# Patient Record
Sex: Female | Born: 1985 | Race: White | Hispanic: No | Marital: Married | Smoking: Former smoker
Health system: Southern US, Community
[De-identification: ages and names within clinical notes are randomized; demographics above are authoritative.]

## PROBLEM LIST (undated history)

## (undated) DIAGNOSIS — T7840XA Allergy, unspecified, initial encounter: Secondary | ICD-10-CM

## (undated) DIAGNOSIS — F319 Bipolar disorder, unspecified: Secondary | ICD-10-CM

## (undated) DIAGNOSIS — F1910 Other psychoactive substance abuse, uncomplicated: Secondary | ICD-10-CM

## (undated) DIAGNOSIS — J349 Unspecified disorder of nose and nasal sinuses: Secondary | ICD-10-CM

## (undated) DIAGNOSIS — F419 Anxiety disorder, unspecified: Secondary | ICD-10-CM

## (undated) DIAGNOSIS — I10 Essential (primary) hypertension: Secondary | ICD-10-CM

## (undated) DIAGNOSIS — D649 Anemia, unspecified: Secondary | ICD-10-CM

## (undated) HISTORY — DX: Anemia, unspecified: D64.9

## (undated) HISTORY — DX: Other psychoactive substance abuse, uncomplicated: F19.10

## (undated) HISTORY — DX: Allergy, unspecified, initial encounter: T78.40XA

## (undated) HISTORY — PX: OTHER SURGICAL HISTORY: SHX169

## (undated) HISTORY — DX: Essential (primary) hypertension: I10

---
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
## 2006-01-07 ENCOUNTER — Emergency Department: Payer: Self-pay | Admitting: General Practice

## 2007-09-29 ENCOUNTER — Inpatient Hospital Stay: Payer: Self-pay | Admitting: Psychiatry

## 2007-09-29 ENCOUNTER — Inpatient Hospital Stay: Payer: Self-pay | Admitting: Internal Medicine

## 2007-09-29 ENCOUNTER — Other Ambulatory Visit: Payer: Self-pay

## 2010-04-12 ENCOUNTER — Emergency Department: Payer: Self-pay | Admitting: Emergency Medicine

## 2015-04-17 DIAGNOSIS — F319 Bipolar disorder, unspecified: Secondary | ICD-10-CM | POA: Insufficient documentation

## 2015-04-17 DIAGNOSIS — F411 Generalized anxiety disorder: Secondary | ICD-10-CM | POA: Insufficient documentation

## 2015-05-26 ENCOUNTER — Other Ambulatory Visit: Payer: Self-pay | Admitting: Obstetrics and Gynecology

## 2015-05-26 DIAGNOSIS — N979 Female infertility, unspecified: Secondary | ICD-10-CM

## 2015-06-01 ENCOUNTER — Ambulatory Visit
Admission: RE | Admit: 2015-06-01 | Discharge: 2015-06-01 | Disposition: A | Discharge status: Home / Self Care | Payer: BLUE CROSS/BLUE SHIELD | Source: Ambulatory Visit | Attending: Obstetrics and Gynecology | Admitting: Obstetrics and Gynecology

## 2015-06-01 ENCOUNTER — Ambulatory Visit: Admission: RE | Admit: 2015-06-01 | Payer: Self-pay | Source: Ambulatory Visit

## 2015-06-01 DIAGNOSIS — N979 Female infertility, unspecified: Secondary | ICD-10-CM | POA: Diagnosis present

## 2017-05-13 ENCOUNTER — Other Ambulatory Visit: Payer: Self-pay

## 2017-05-13 ENCOUNTER — Ambulatory Visit
Admission: EM | Admit: 2017-05-13 | Discharge: 2017-05-13 | Disposition: A | Discharge status: Home / Self Care | Payer: BLUE CROSS/BLUE SHIELD | Attending: Family Medicine | Admitting: Family Medicine

## 2017-05-13 DIAGNOSIS — L304 Erythema intertrigo: Secondary | ICD-10-CM | POA: Diagnosis not present

## 2017-05-13 HISTORY — DX: Unspecified disorder of nose and nasal sinuses: J34.9

## 2017-05-13 HISTORY — DX: Anxiety disorder, unspecified: F41.9

## 2017-05-13 HISTORY — DX: Bipolar disorder, unspecified: F31.9

## 2017-05-13 MED ORDER — KETOCONAZOLE 2 % EX CREA: 1.0000 "application " | TOPICAL_CREAM | Freq: Every day | CUTANEOUS | 0 refills | Status: DC

## 2017-05-13 MED ORDER — TRIAMCINOLONE ACETONIDE 0.025 % EX CREA
1.0000 | TOPICAL_CREAM | Freq: Two times a day (BID) | CUTANEOUS | 0 refills | Status: DC
Start: 2017-05-13 — End: 2017-05-13

## 2017-05-13 MED ORDER — TRIAMCINOLONE ACETONIDE 0.025 % EX CREA: 1.0000 "application " | TOPICAL_CREAM | Freq: Two times a day (BID) | CUTANEOUS | 0 refills | Status: DC

## 2017-05-13 MED ORDER — MUPIROCIN 2 % EX OINT: 1.0000 "application " | TOPICAL_OINTMENT | Freq: Three times a day (TID) | CUTANEOUS | 0 refills | Status: DC

## 2017-05-13 MED ORDER — FLUCONAZOLE 150 MG PO TABS: ORAL_TABLET | ORAL | 0 refills | Status: DC

## 2017-05-13 NOTE — ED Provider Notes (Signed)
[facility]    CSN: [id number] Arrival date & time: 05/13/17  0901     History   Chief Complaint Chief Complaint  Patient presents with  . Rash    HPI Hailey Castaneda is a 32 y.o. female.   HPI  32 year old female presents with 4-5-week history of rash on her breasts.  Is that the skin is starting to break down and it is very malodorous.  Is now switched from polyester type bras to sport bras in an effort to decrease the irritation.  Been using over-the-counter nystatin cream and powder but none has seemed to work well.  No fever or chills.          Past Medical History:  Diagnosis Date  . Anxiety   . Bipolar 1 disorder (HCC)   . Sinus disorder     There are no active problems to display for this patient.   History reviewed. No pertinent surgical history.  OB History   None      Home Medications    Prior to Admission medications   Medication Sig Start Date End Date Taking? Authorizing Provider  ALPRAZolam Prudy Feeler) 0.5 MG tablet Take by mouth. 04/14/17  Yes [provider]  cetirizine (ZYRTEC) 10 MG tablet Take by mouth. 02/20/17  Yes [provider]  fluticasone (FLONASE) 50 MCG/ACT nasal spray Place into the nose. 02/20/17  Yes [provider]  lamoTRIgine (LAMICTAL) 150 MG tablet Take by mouth. 02/20/17  Yes [provider]  fluconazole (DIFLUCAN) 150 MG tablet Take 1 tab weekly for 4 weeks. 05/13/17   Lutricia Feil, PA-C  ketoconazole (NIZORAL) 2 % cream Apply 1 application topically daily. 05/13/17   Lutricia Feil, PA-C  mupirocin ointment (BACTROBAN) 2 % Apply 1 application topically 3 (three) times daily. 05/13/17   Lutricia Feil, PA-C  triamcinolone (KENALOG) 0.025 % cream Apply 1 application topically 2 (two) times daily. 05/13/17   Lutricia Feil, PA-C    Family History History reviewed. No pertinent family history.  Social History Social History   Tobacco Use  . Smoking status: Former  Games developer  . Smokeless tobacco: Never Used  Substance Use Topics  . Alcohol use: Yes    Comment: social  . Drug use: Never     Allergies   Azithromycin and Penicillins   Review of Systems Review of Systems  Constitutional: Negative for activity change, appetite change, chills, fatigue and fever.  Skin: Positive for rash and wound.  All other systems reviewed and are negative.    Physical Exam Triage Vital Signs ED Triage Vitals [05/13/17 0921]  Enc Vitals Group     BP (!) 158/116     Pulse Rate 90     Resp 16     Temp 98.4 F (36.9 C)     Temp Source Oral     SpO2 100 %     Weight 147 lb (66.7 kg)     Height 5\' 6"  (1.676 m)     Head Circumference      Peak Flow      Pain Score 5     Pain Loc      Pain Edu?      Excl. in GC?    No data found.  Updated Vital Signs BP (!) 158/116 (BP Location: Right Arm)   Pulse 90   Temp 98.4 F (36.9 C) (Oral)   Resp 16   Ht 5\' 6"  (1.676 m)   Wt 147 lb (  66.7 kg)   LMP 05/08/2017   SpO2 100%   BMI 23.73 kg/m   Visual Acuity Right Eye Distance:   Left Eye Distance:   Bilateral Distance:    Right Eye Near:   Left Eye Near:    Bilateral Near:     Physical Exam  Constitutional: She is oriented to person, place, and time. She appears well-developed and well-nourished. No distress.  HENT:  Head: Normocephalic.  Eyes: Pupils are equal, round, and reactive to light. Right eye exhibits no discharge. Left eye exhibits no discharge.  Neck: Normal range of motion.  Musculoskeletal: Normal range of motion.  Neurological: She is alert and oriented to person, place, and time.  Skin: Skin is warm and dry. Rash noted. She is not diaphoretic.  Examination was performed in the presence of Gunnar FusiPaula, CMA as Nurse, children'schaperone and assistant.  Skin breakdown of the inframammary skin folds.  It is very malodorous.  Area erythematous areas of weeping are apparent.  Not appear to be infection at the present time but has a very high risk.  There are  several small excoriations present.  Psychiatric: She has a normal mood and affect. Her behavior is normal. Judgment and thought content normal.  Nursing note and vitals reviewed.    UC Treatments / Results  Labs (all labs ordered are listed, but only abnormal results are displayed) Labs Reviewed - No data to display  EKG None Radiology No results found.  Procedures Procedures (including critical care time)  Medications Ordered in UC Medications - No data to display   Initial Impression / Assessment and Plan / UC Course  I have reviewed the triage vital signs and the nursing notes.  Pertinent labs & imaging results that were available during my care of the patient were reviewed by me and considered in my medical decision making (see chart for details).     Plan: 1. Test/x-ray results and diagnosis reviewed with patient 2. rx as per orders; risks, benefits, potential side effects reviewed with patient 3. Recommend supportive treatment with meticulous drying of the area.  Using a hair dryer on cool setting after showers.  Recommend use of drying powders between treatments.  Switch her to Nizoral cream that she will use on a daily basis.  So we will treat her with Diflucan 150 mg once weekly for the next 4 weeks.  Given her triamcinolone 0.025% that she may use twice daily for severe itching.  Also given her a Bactroban ointment for secondary infection should any occur.  Told that this will take 3-4 weeks for healing.  Is not improving I have given her the name of a dermatologist that she may schedule an appointment. 4. F/u prn if symptoms worsen or don't improve   Final Clinical Impressions(s) / UC Diagnoses   Final diagnoses:  Intertrigo    ED Discharge Orders        Ordered    ketoconazole (NIZORAL) 2 % cream  Daily     05/13/17 1037    triamcinolone (KENALOG) 0.025 % cream  2 times daily,   Status:  Discontinued     05/13/17 1037    mupirocin ointment (BACTROBAN) 2 %   3 times daily     05/13/17 1037    fluconazole (DIFLUCAN) 150 MG tablet     05/13/17 1037    triamcinolone (KENALOG) 0.025 % cream  2 times daily     05/13/17 1039       Controlled Substance Prescriptions [place] Controlled Substance  Registry consulted? Not Applicable   Lutricia Feil, PA-C 05/13/17 1052

## 2017-05-13 NOTE — ED Triage Notes (Signed)
Pt reports 4-5 weeks ago she noticed a rash under her breasts. She thinks it is yeast. Skin is starting to break down.

## 2017-05-14 ENCOUNTER — Telehealth: Payer: Self-pay | Admitting: Emergency Medicine

## 2017-05-14 NOTE — Telephone Encounter (Signed)
Patient calling stating that she has "red dots" some of them raised on her neck, upper chest and stomach since this morning. Patient is concerned that this might be an allergic reaction to medications she was given yesterday. Advised patient that it is possible, but without seeing the rash we can't be certain. Patient states she doesn't want to come in and pay another $100 co-pay. She states she has taken Benadryl to see if that helps. Advised to continue Benadryl and if rash gets worse or gets swelling, sob to go to ED. Patient agreed and voiced understanding.

## 2018-02-03 IMAGING — RF DG HYSTEROGRAM
1 series · 1 of 1 positions shown · IV contrast (omnipaque)
Comparison: None in PACs

CLINICAL DATA: Beam else the relatively issues

EXAM:
HYSTEROSALPINGOGRAM
TECHNIQUE: Following cleansing of the cervix and vagina with Betadine solution,
a hysterosalpingogram was performed using a 5-French
hysterosalpingogram catheter and Omnipaque 300 contrast. The patient
tolerated the examination without difficulty.

[Series 1: cp_standard · 0.17mm/px · 1 of 1 slices shown]
[im 1/1]
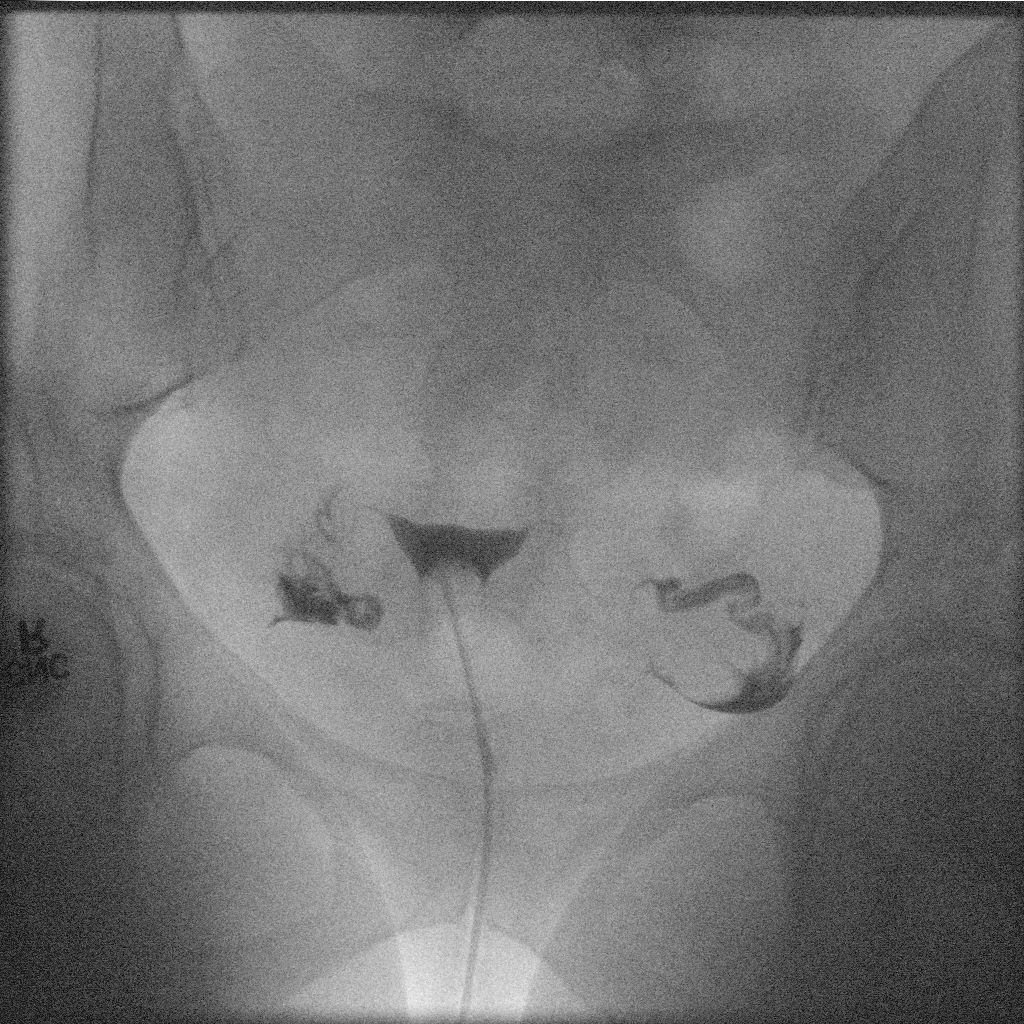

[1 of 1 positions shown; findings below may reference images not displayed]

FLUOROSCOPY TIME:  Fluoroscopy Time:  0 minutes, 30 seconds

Number of Acquired Images:  1 fluoro spot saved image
FINDINGS: The contour the uterus is normal. The fallopian tubes to appear to
be normal in caliber. Free spillage is observed bilaterally.
IMPRESSION: The uterus and fallopian tubes are normal in appearance.

## 2018-09-22 DIAGNOSIS — L409 Psoriasis, unspecified: Secondary | ICD-10-CM | POA: Insufficient documentation

## 2018-09-22 DIAGNOSIS — F1010 Alcohol abuse, uncomplicated: Secondary | ICD-10-CM | POA: Insufficient documentation

## 2018-11-09 ENCOUNTER — Other Ambulatory Visit: Payer: Self-pay

## 2018-11-09 DIAGNOSIS — Z20822 Contact with and (suspected) exposure to covid-19: Secondary | ICD-10-CM

## 2018-11-11 LAB — NOVEL CORONAVIRUS, NAA: SARS-CoV-2, NAA: NOT DETECTED

## 2020-06-21 ENCOUNTER — Encounter: Payer: Self-pay | Admitting: Nurse Practitioner

## 2020-06-21 ENCOUNTER — Ambulatory Visit (INDEPENDENT_AMBULATORY_CARE_PROVIDER_SITE_OTHER): Payer: 59 | Admitting: Nurse Practitioner

## 2020-06-21 ENCOUNTER — Other Ambulatory Visit: Payer: Self-pay

## 2020-06-21 VITALS — BP 126/98 | HR 139 | Temp 99.0°F | Ht 64.8 in | Wt 127.4 lb

## 2020-06-21 DIAGNOSIS — R000 Tachycardia, unspecified: Secondary | ICD-10-CM

## 2020-06-21 DIAGNOSIS — R5383 Other fatigue: Secondary | ICD-10-CM

## 2020-06-21 DIAGNOSIS — R200 Anesthesia of skin: Secondary | ICD-10-CM

## 2020-06-21 DIAGNOSIS — R202 Paresthesia of skin: Secondary | ICD-10-CM

## 2020-06-21 DIAGNOSIS — Z7689 Persons encountering health services in other specified circumstances: Secondary | ICD-10-CM

## 2020-06-21 DIAGNOSIS — F411 Generalized anxiety disorder: Secondary | ICD-10-CM

## 2020-06-21 DIAGNOSIS — F319 Bipolar disorder, unspecified: Secondary | ICD-10-CM | POA: Diagnosis not present

## 2020-06-21 DIAGNOSIS — L409 Psoriasis, unspecified: Secondary | ICD-10-CM

## 2020-06-21 NOTE — Progress Notes (Signed)
BP (!) 126/98   Pulse (!) 139   Temp 99 F (37.2 C)   Ht 5' 4.8" (1.646 m)   Wt 127 lb 6 oz (57.8 kg)   SpO2 97%   BMI 21.33 kg/m    Subjective:    Patient ID: Hailey Castaneda, female    DOB: [birth year], 35 y.o.   MRN: [id number]  HPI: Hailey Castaneda is a 35 y.o. female  Chief Complaint  Patient presents with  . Establish Care  . Amenorrhea    Has not had a cycle since August 2021  . Weight Loss    Patient has lost around 26 pounds in the last 4 months  . Joint Pain  . Palpitations  . Numbness    Fingers and toes   Patient presents to clinic to establish care with new PCP.  Patient reports a history of Bipolar, Anxiety, Depression, Alcohol abuse, and psoriasis.  Patient sees Dr. Phillip Heal for Dermatology. Patient states she diagnosed with Lupus as a child.  Has had positive ANA's in the past. Patient has not seen a rheumatologist recently. She has the butterfly rash and swollen lymph nodes.   Patient denies a history of: Hypertension, Elevated Cholesterol, Thyroid problems, Diabetes, Neurological problems, and Abdominal problems.   Over the past 4 months patient has unintentionally lost 26lbs, lack of appetite, she feels like she has to force herself to eat at times, Patient is having numbness and tingling in her fingers and toes. Patient has fatigue.  Patient is having trouble sleeping.  Feels like she has been asleep for several hours and wakes up and its only been 20 mins.  Feels her heart beating fast.   Relevant past medical, surgical, family and social history reviewed and updated as indicated. Interim medical history since our last visit reviewed. Allergies and medications reviewed and updated.  Review of Systems  Constitutional: Positive for appetite change, fatigue and unexpected weight change.  Gastrointestinal:       Decreased appetite  Neurological: Positive for numbness.  Psychiatric/Behavioral: Positive for sleep disturbance. The patient is  nervous/anxious.     Per HPI unless specifically indicated above     Objective:    BP (!) 126/98   Pulse (!) 139   Temp 99 F (37.2 C)   Ht 5' 4.8" (1.646 m)   Wt 127 lb 6 oz (57.8 kg)   SpO2 97%   BMI 21.33 kg/m   Wt Readings from Last 3 Encounters:  06/21/20 127 lb 6 oz (57.8 kg)  05/13/17 147 lb (66.7 kg)    Physical Exam Vitals and nursing note reviewed.  Constitutional:      General: She is not in acute distress.    Appearance: Normal appearance. She is normal weight. She is not ill-appearing, toxic-appearing or diaphoretic.  HENT:     Head: Normocephalic.     Right Ear: External ear normal.     Left Ear: External ear normal.     Nose: Nose normal.     Mouth/Throat:     Mouth: Mucous membranes are moist.     Pharynx: Oropharynx is clear.  Eyes:     General:        Right eye: No discharge.        Left eye: No discharge.     Extraocular Movements: Extraocular movements intact.     Conjunctiva/sclera: Conjunctivae normal.     Pupils: Pupils are equal, round, and reactive to light.  Cardiovascular:     Rate  and Rhythm: Regular rhythm. Tachycardia present.     Heart sounds: No murmur heard.   Pulmonary:     Effort: Pulmonary effort is normal. No respiratory distress.     Breath sounds: Normal breath sounds. No wheezing or rales.  Musculoskeletal:     Cervical back: Normal range of motion and neck supple.  Skin:    General: Skin is warm and dry.     Capillary Refill: Capillary refill takes less than 2 seconds.  Neurological:     General: No focal deficit present.     Mental Status: She is alert and oriented to person, place, and time. Mental status is at baseline.  Psychiatric:        Mood and Affect: Mood normal.        Behavior: Behavior normal.        Thought Content: Thought content normal.        Judgment: Judgment normal.     Results for orders placed or performed in visit on 11/09/18  Novel Coronavirus, NAA ([facility])   Specimen:  Oropharyngeal(OP) collection in vial transport medium   OROPHARYNGEA  TESTING  Result Value Ref Range   SARS-CoV-2, NAA Not Detected Not Detected      Assessment & Plan:   Problem List Items Addressed This Visit      Musculoskeletal and Integument   Psoriasis    Chronic.  Controlled on current regimen.  Followed by Dr. Phillip Heal.  Due to patient's symptoms, can consider possibility of psoriatic arthritis.  Labs ordered today. Will likely refer to Rheumatology.         Other   Bipolar 1 disorder ([place]) - Primary    Chronic.  Controlled.  Previously followed by her PCP.  Has not seen Psychiatry in many years.  Stable on current dose of Lamictal. Referral placed for patient to see Psychiatry.       Relevant Orders   Ambulatory referral to Psychiatry   GAD (generalized anxiety disorder)    Chronic.  Controlled.  Previously followed by her PCP.  Has not seen Psychiatry in many years.  Patient uses about 20 Xanax per month from PCP. Referral placed for patient to see Psychiatry.       Relevant Medications   hydrOXYzine (ATARAX/VISTARIL) 25 MG tablet   Other Relevant Orders   Ambulatory referral to Psychiatry   Comp Met (CMET)    Other Visit Diagnoses    Other fatigue       Labs ordered today to rule out thyroid disorder or autoimmune disease. Will make recommendations based on lab work.    Relevant Orders   B12   Vitamin D (25 hydroxy)   Antinuclear Antib (ANA)   C-reactive protein   Sed Rate (ESR)   Autoimmune Profile   Thyroid Panel With TSH   Numbness and tingling       Labs ordered today to rule out thyroid disorder or autoimmune disease. Will make recommendations based on lab work.    Relevant Orders   CBC w/Diff   C-reactive protein   Sed Rate (ESR)   Autoimmune Profile   Thyroid Panel With TSH   Tachycardia       EKG ordered in office today. ST depression noted on EKG. Will refer to Cardiology for further evaluation.   Relevant Orders   EKG 12-Lead (Completed)    Autoimmune Profile   Thyroid Panel With TSH   Ambulatory referral to Cardiology   Encounter to establish care  Follow up plan: Return in about 3 months (around 09/21/2020) for Physical and Fasting labs (PAP).

## 2020-06-21 NOTE — Assessment & Plan Note (Signed)
Chronic.  Controlled on current regimen.  Followed by Dr. Cheree Ditto.  Due to patient's symptoms, can consider possibility of psoriatic arthritis.  Labs ordered today. Will likely refer to Rheumatology.

## 2020-06-21 NOTE — Assessment & Plan Note (Signed)
Chronic.  Controlled.  Previously followed by her PCP.  Has not seen Psychiatry in many years.  Stable on current dose of Lamictal. Referral placed for patient to see Psychiatry.

## 2020-06-21 NOTE — Progress Notes (Signed)
Results discussed with patient during office visit today.  ST depression noted. Referral to Cardiology placed.

## 2020-06-21 NOTE — Assessment & Plan Note (Signed)
Chronic.  Controlled.  Previously followed by her PCP.  Has not seen Psychiatry in many years.  Patient uses about 20 Xanax per month from PCP. Referral placed for patient to see Psychiatry.

## 2020-06-22 LAB — CBC WITH DIFFERENTIAL/PLATELET
Basophils Absolute: 0.1 10*3/uL (ref 0.0–0.2)
Basos: 2 %
EOS (ABSOLUTE): 0.3 10*3/uL (ref 0.0–0.4)
Eos: 4 %
Hematocrit: 40.9 % (ref 34.0–46.6)
Hemoglobin: 14.7 g/dL (ref 11.1–15.9)
Immature Grans (Abs): 0 10*3/uL (ref 0.0–0.1)
Immature Granulocytes: 0 %
Lymphocytes Absolute: 2.7 10*3/uL (ref 0.7–3.1)
Lymphs: 37 %
MCH: 35.3 pg — ABNORMAL HIGH (ref 26.6–33.0)
MCHC: 35.9 g/dL — ABNORMAL HIGH (ref 31.5–35.7)
MCV: 98 fL — ABNORMAL HIGH (ref 79–97)
Monocytes Absolute: 1 10*3/uL — ABNORMAL HIGH (ref 0.1–0.9)
Monocytes: 13 %
Neutrophils Absolute: 3.2 10*3/uL (ref 1.4–7.0)
Neutrophils: 44 %
Platelets: 298 10*3/uL (ref 150–450)
RBC: 4.16 x10E6/uL (ref 3.77–5.28)
RDW: 14.9 % (ref 11.7–15.4)
WBC: 7.3 10*3/uL (ref 3.4–10.8)

## 2020-06-22 LAB — VITAMIN B12: Vitamin B-12: 238 pg/mL (ref 232–1245)

## 2020-06-22 LAB — COMPREHENSIVE METABOLIC PANEL
ALT: 52 IU/L — ABNORMAL HIGH (ref 0–32)
AST: 142 IU/L — ABNORMAL HIGH (ref 0–40)
Albumin/Globulin Ratio: 1 — ABNORMAL LOW (ref 1.2–2.2)
Albumin: 3.6 g/dL — ABNORMAL LOW (ref 3.8–4.8)
Alkaline Phosphatase: 174 IU/L — ABNORMAL HIGH (ref 44–121)
BUN/Creatinine Ratio: 7 — ABNORMAL LOW (ref 9–23)
BUN: 6 mg/dL (ref 6–20)
Bilirubin Total: 0.9 mg/dL (ref 0.0–1.2)
CO2: 23 mmol/L (ref 20–29)
Calcium: 9.4 mg/dL (ref 8.7–10.2)
Chloride: 98 mmol/L (ref 96–106)
Creatinine, Ser: 0.83 mg/dL (ref 0.57–1.00)
Globulin, Total: 3.5 g/dL (ref 1.5–4.5)
Glucose: 94 mg/dL (ref 65–99)
Potassium: 3.5 mmol/L (ref 3.5–5.2)
Sodium: 140 mmol/L (ref 134–144)
Total Protein: 7.1 g/dL (ref 6.0–8.5)
eGFR: 94 mL/min/{1.73_m2} (ref >59)

## 2020-06-22 LAB — SEDIMENTATION RATE: Sed Rate: 10 mm/hr (ref 0–32)

## 2020-06-22 LAB — C-REACTIVE PROTEIN: CRP: <1 mg/L (ref 0–10)

## 2020-06-22 LAB — AUTOIMMUNE PROFILE
Anti Nuclear Antibody (ANA): NEGATIVE
Complement C3, Serum: 178 mg/dL — ABNORMAL HIGH (ref 82–167)
dsDNA Ab: <1 IU/mL (ref 0–9)

## 2020-06-22 LAB — THYROID PANEL WITH TSH
Free Thyroxine Index: 1.7 (ref 1.2–4.9)
T3 Uptake Ratio: 28 % (ref 24–39)
T4, Total: 6 ug/dL (ref 4.5–12.0)
TSH: 3.83 u[IU]/mL (ref 0.450–4.500)

## 2020-06-22 LAB — VITAMIN D 25 HYDROXY (VIT D DEFICIENCY, FRACTURES): Vit D, 25-Hydroxy: 18.9 ng/mL — ABNORMAL LOW (ref 30.0–100.0)

## 2020-06-23 NOTE — Progress Notes (Signed)
Please let patient know that her lab work shows her vitamin B12 is within normal range. Vitamin D is low.  I recommend taking Vitamin D 1000IU once daily.  This could contribute to patient's fatigue. Blood cell count shows that blood cells are larger than normal.  However, there is no anemia present.   Kidneys and electrolytes are within normal limites.  However, Liver function is elevated.  Likely due to alcohol use.  I recommend continuing to cut back on drinking with the goal of stopping.   Inflammatory markers are normal. ANA is normal but C3 is elevated and can be an indication of inflammation. I think it would still be worthwhile to see a rheumatologist.   Your thyroid labs are also normal. Please let me know if you have any questions.

## 2020-06-23 NOTE — Addendum Note (Signed)
Addended by: Larae Grooms on: 06/23/2020 01:57 PM   Modules accepted: Orders

## 2020-08-09 ENCOUNTER — Encounter: Payer: Self-pay | Admitting: Cardiovascular Disease

## 2020-08-09 ENCOUNTER — Other Ambulatory Visit: Payer: Self-pay

## 2020-08-09 ENCOUNTER — Telehealth: Payer: Self-pay

## 2020-08-09 ENCOUNTER — Ambulatory Visit (INDEPENDENT_AMBULATORY_CARE_PROVIDER_SITE_OTHER): Payer: 59 | Admitting: Cardiovascular Disease

## 2020-08-09 ENCOUNTER — Ambulatory Visit (INDEPENDENT_AMBULATORY_CARE_PROVIDER_SITE_OTHER): Payer: 59

## 2020-08-09 VITALS — BP 105/72 | HR 140 | Ht 65.5 in | Wt 119.0 lb

## 2020-08-09 DIAGNOSIS — F1010 Alcohol abuse, uncomplicated: Secondary | ICD-10-CM | POA: Diagnosis not present

## 2020-08-09 DIAGNOSIS — R000 Tachycardia, unspecified: Secondary | ICD-10-CM | POA: Diagnosis not present

## 2020-08-09 DIAGNOSIS — F411 Generalized anxiety disorder: Secondary | ICD-10-CM

## 2020-08-09 DIAGNOSIS — R9431 Abnormal electrocardiogram [ECG] [EKG]: Secondary | ICD-10-CM

## 2020-08-09 DIAGNOSIS — F319 Bipolar disorder, unspecified: Secondary | ICD-10-CM

## 2020-08-09 NOTE — Patient Instructions (Addendum)
Medication Instructions:  No changes  If you need a refill on your cardiac medications before your next appointment, please call your pharmacy.   Lab work: No new labs needed  Testing/Procedures: Echo  Zio (heart monitor) Both for tachycardia   Follow-Up: At [facility], you and your health needs are our priority.  As part of our continuing mission to provide you with exceptional heart care, we have created designated Provider Care Teams.  These Care Teams include your primary Cardiologist (physician) and Advanced Practice Providers (APPs -  Physician Assistants and Nurse Practitioners) who all work together to provide you with the care you need, when you need it.  You will need a follow up appointment : we will call with results  Providers on your designated Care Team:   Nicolasa Ducking, NP Eula Listen, PA-C Marisue Ivan, PA-C [place], [place]  COVID-19 Vaccine Information can be found at: [email or web address] For questions related to vaccine distribution or appointments, please email [email or web address] or call [phone])[phone].    Your physician has requested that you have an echocardiogram. Echocardiography is a painless test that uses sound waves to create images of your heart. It provides your doctor with information about the size and shape of your heart and how well your heart's chambers and valves are working. This procedure takes approximately one hour. There are no restrictions for this procedure.  There is a possibility that an IV may need to be started during your test to inject an image enhancing agent. This is done to obtain more optimal pictures of your heart. Therefore we ask that you do at least drink some water prior to coming in to hydrate your veins.   Heart monitor (Zio patch) for 2 weeks (14 days)   Your physician has recommended that you wear a Zio monitor. This monitor is a medical device that  records the heart's electrical activity. Doctors most often use these monitors to diagnose arrhythmias. Arrhythmias are problems with the speed or rhythm of the heartbeat. The monitor is a small device applied to your chest. You can wear one while you do your normal daily activities. While wearing this monitor if you have any symptoms to push the button and record what you felt. Once you have worn this monitor for the period of time provider prescribed (Usually 14 days), you will return the monitor device in the postage paid box. Once it is returned they will download the data collected and provide [place] with a report which the provider will then review and we will call you with those results. Important tips:  Avoid showering during the first 24 hours of wearing the monitor. Avoid excessive sweating to help maximize wear time. Do not submerge the device, no hot tubs, and no swimming pools. Keep any lotions or oils away from the patch. After 24 hours you may shower with the patch on. Take brief showers with your back facing the shower head.  Do not remove patch once it has been placed because that will interrupt data and decrease adhesive wear time. Push the button when you have any symptoms and write down what you were feeling. Once you have completed wearing your monitor, remove and place into box which has postage paid and place in your outgoing mailbox.  If for some reason you have misplaced your box then call our office and we can provide another box and/or mail it off for you.

## 2020-08-09 NOTE — Progress Notes (Signed)
Cardiology Office Note  Date:  08/09/2020   ID:  Hailey Castaneda, DOB January 01, 1986, MRN [id number]  PCP:  Hailey Grooms, NP   CC: Tachycardia, abnormal EKG  HPI:  Hailey Castaneda is a 35 year old woman With history of bipolar disorder/anxiety/depression/alcohol abuse Referred by Hailey Castaneda for abnormal EKG, tachycardia  Last visit with primary care June 21, 2020 heart rate measured at 139 bpm EKG showing sinus tachycardia rate 128 bpm  EKG in September 29, 2007 showing sinus tachycardia rate 124 bpm Epic notes indicating heart rate 100 back in 2019  Notes from primary care indicate last 4 months patient has unintentionally lost 26lbs, lack of appetite,  Has to force herself to eat at times,  having numbness and tingling in her fingers and toes. Fatigue, trouble sleeping.    Feels her heart beating fast.  When she is trying to sleep  Seen by rheumatology  Etoh starting early 20s, Mom passed, triggered heavier drinking Quit smoking Trouble sleeping,  Neuropathy in hands, poor diet Weight loss, unclear etiology Covid several times, last 01/2020, since then low appetite  Works 3rd shift, does home care  Does not appreciate her tachycardia, resting heart rate at home typically knee 90 range Denies chest pain, shortness of breath on exertion  EKG personally reviewed by myself on todays visit Shows normal sinus rhythm rate 61 bpm bipolar branch block   PMH:   has a past medical history of Anxiety, Bipolar 1 disorder (HCC), and Sinus disorder.  PSH:    Past Surgical History:  Procedure Laterality Date   lymph node removal     chain of 9, beign     Current Outpatient Medications  Medication Sig Dispense Refill   ALPRAZolam (XANAX) 0.5 MG tablet Take by mouth.     cetirizine (ZYRTEC) 10 MG tablet Take by mouth.     fluticasone (FLONASE) 50 MCG/ACT nasal spray Place into the nose.     hydrOXYzine (ATARAX/VISTARIL) 25 MG tablet Take by mouth.     lamoTRIgine (LAMICTAL)  150 MG tablet Take 225 mg by mouth.     TALTZ 80 MG/ML SOAJ Inject into the skin.     No current facility-administered medications for this visit.     Allergies:   Azithromycin and Penicillins   Social History:  The patient  reports that she quit smoking about 11 years ago. Her smoking use included cigarettes. She has never used smokeless tobacco. She reports current alcohol use. She reports that she does not use drugs.   Family History:   family history includes COPD in her mother; Cancer in her maternal grandmother; Fibromyalgia in her mother; Heart attack in her mother; Heart disease in her maternal grandmother and mother; Hypertension in her maternal grandmother and mother; Osteopenia in her paternal grandmother.    Review of Systems: Review of Systems  Constitutional: Negative.   HENT: Negative.    Respiratory: Negative.    Cardiovascular: Negative.   Gastrointestinal: Negative.   Musculoskeletal: Negative.   Neurological: Negative.   Psychiatric/Behavioral: Negative.    All other systems reviewed and are negative.   PHYSICAL EXAM: VS:  BP 105/72   Pulse (!) 140   Ht 5' 5.5" (1.664 m)   Wt 119 lb (54 kg)   BMI 19.50 kg/m  , BMI Body mass index is 19.5 kg/m. GEN: Well nourished, well developed, in no acute distress HEENT: normal Neck: no JVD, carotid bruits, or masses Cardiac: Regular rhythm, tachycardic no murmurs, rubs, or gallops,no edema  Respiratory:  clear to auscultation bilaterally, normal work of breathing GI: soft, nontender, nondistended, + BS MS: no deformity or atrophy Skin: warm and dry, no rash Neuro:  Strength and sensation are intact Psych: euthymic mood, full affect  Recent Labs: 06/21/2020: ALT 52; BUN 6; Creatinine, Ser 0.83; Hemoglobin 14.7; Platelets 298; Potassium 3.5; Sodium 140; TSH 3.830    Lipid Panel No results found for: CHOL, HDL, LDLCALC, TRIG    Wt Readings from Last 3 Encounters:  08/09/20 119 lb (54 kg)  06/21/20 127 lb 6 oz  (57.8 kg)  05/13/17 147 lb (66.7 kg)      ASSESSMENT AND PLAN:  Problem List Items Addressed This Visit     Alcohol abuse   GAD (generalized anxiety disorder)   Other Visit Diagnoses     Sinus tachycardia    -  Primary   Relevant Orders   EKG 12-Lead   ECHOCARDIOGRAM COMPLETE   LONG TERM MONITOR (3-14 DAYS)   Bipolar affective disorder, remission status unspecified (HCC)       Nonspecific abnormal electrocardiogram (ECG) (EKG)       Relevant Orders   ECHOCARDIOGRAM COMPLETE   LONG TERM MONITOR (3-14 DAYS)      Sinus tachycardia Baseline heart rate at home she reports in the 90s ZIO monitor ordered Asymptomatic in general, no room on her blood pressure to add beta-blocker Echocardiogram ordered to rule out structural heart disease especially in light of heavy alcohol abuse/binging Alcohol cessation recommended, recommend she stay hydrated Elevated heart rate may be exacerbated by dramatic weight loss, hypertension  Alcohol abuse Cessation recommended, need to stabilize weight, maintain hydration  Hand numbness/neuropathy Recommend multivitamin, minerals, B12, D Better nutrition, avoid alcohol      Total encounter time more than60 minutes  Greater than 50% was spent in counseling and coordination of care with the patient  Patient was seen in consultation for Hailey Castaneda and will be referred back to her office for ongoing care of the issues detailed above  Signed, Dossie Arbour, M.D., Ph.D. [facility] [place], [place] [phone]

## 2020-08-09 NOTE — Telephone Encounter (Signed)
Patient was here in the office today being seen by Dr. Mariah Milling.  Patient was ordered to wear a ZIO XT monitor.   I placed the first one on her after prepping the skin and when I went to activate it, it would only light up orange.   I took that monitor off of her and tried to place another monitor (after cleaning the skin from where I had to use the adhesive remover) and that monitor done the same thing -- only would light up orange.   Monitor 1: [id number] Monitor 2: [id number]  I then just told the patient that we would mail her a monitor because she stated it was hurting her with [place] putting on and taking off and that she didn't want to try another one today.   I then sent Meredith (ZIO rep) an email about what happened with the monitors.   She called and stated if the patient is very thin or extremely obese the monitor will not have good connection for the ZIO monitor. She advised [place] to call the patient and see if when she gets her monitor in the mail if she will be okay coming in the office and having [place] place it on her back.

## 2020-08-11 DIAGNOSIS — R9431 Abnormal electrocardiogram [ECG] [EKG]: Secondary | ICD-10-CM

## 2020-08-11 DIAGNOSIS — R000 Tachycardia, unspecified: Secondary | ICD-10-CM

## 2020-08-30 ENCOUNTER — Other Ambulatory Visit: Payer: 59

## 2020-09-11 ENCOUNTER — Other Ambulatory Visit: Payer: Self-pay

## 2020-09-11 ENCOUNTER — Emergency Department: Payer: 59

## 2020-09-11 ENCOUNTER — Inpatient Hospital Stay
Admission: EM | Admit: 2020-09-11 | Discharge: 2020-09-13 | DRG: 872 | Disposition: A | Discharge status: Home / Self Care | Payer: 59 | Attending: Internal Medicine | Admitting: Internal Medicine

## 2020-09-11 ENCOUNTER — Encounter: Payer: Self-pay | Admitting: Emergency Medicine

## 2020-09-11 DIAGNOSIS — Z79899 Other long term (current) drug therapy: Secondary | ICD-10-CM

## 2020-09-11 DIAGNOSIS — R1011 Right upper quadrant pain: Secondary | ICD-10-CM | POA: Diagnosis present

## 2020-09-11 DIAGNOSIS — N390 Urinary tract infection, site not specified: Secondary | ICD-10-CM | POA: Diagnosis not present

## 2020-09-11 DIAGNOSIS — Z20822 Contact with and (suspected) exposure to covid-19: Secondary | ICD-10-CM | POA: Diagnosis present

## 2020-09-11 DIAGNOSIS — E860 Dehydration: Secondary | ICD-10-CM | POA: Diagnosis present

## 2020-09-11 DIAGNOSIS — R7401 Elevation of levels of liver transaminase levels: Secondary | ICD-10-CM

## 2020-09-11 DIAGNOSIS — G629 Polyneuropathy, unspecified: Secondary | ICD-10-CM | POA: Diagnosis present

## 2020-09-11 DIAGNOSIS — Z880 Allergy status to penicillin: Secondary | ICD-10-CM

## 2020-09-11 DIAGNOSIS — A419 Sepsis, unspecified organism: Secondary | ICD-10-CM | POA: Diagnosis not present

## 2020-09-11 DIAGNOSIS — D539 Nutritional anemia, unspecified: Secondary | ICD-10-CM

## 2020-09-11 DIAGNOSIS — Z881 Allergy status to other antibiotic agents status: Secondary | ICD-10-CM

## 2020-09-11 DIAGNOSIS — K760 Fatty (change of) liver, not elsewhere classified: Secondary | ICD-10-CM

## 2020-09-11 DIAGNOSIS — F319 Bipolar disorder, unspecified: Secondary | ICD-10-CM | POA: Diagnosis present

## 2020-09-11 DIAGNOSIS — E876 Hypokalemia: Secondary | ICD-10-CM

## 2020-09-11 DIAGNOSIS — F419 Anxiety disorder, unspecified: Secondary | ICD-10-CM | POA: Diagnosis present

## 2020-09-11 DIAGNOSIS — E538 Deficiency of other specified B group vitamins: Secondary | ICD-10-CM | POA: Diagnosis present

## 2020-09-11 DIAGNOSIS — M549 Dorsalgia, unspecified: Secondary | ICD-10-CM | POA: Diagnosis present

## 2020-09-11 DIAGNOSIS — F1020 Alcohol dependence, uncomplicated: Secondary | ICD-10-CM

## 2020-09-11 DIAGNOSIS — Z87891 Personal history of nicotine dependence: Secondary | ICD-10-CM

## 2020-09-11 LAB — PROTIME-INR
INR: 1 (ref 0.8–1.2)
Prothrombin Time: 12.7 seconds (ref 11.4–15.2)

## 2020-09-11 LAB — URINALYSIS, COMPLETE (UACMP) WITH MICROSCOPIC
Bilirubin Urine: NEGATIVE
Glucose, UA: NEGATIVE mg/dL
Hgb urine dipstick: NEGATIVE
Ketones, ur: NEGATIVE mg/dL
Leukocytes,Ua: NEGATIVE
Nitrite: POSITIVE — AB
Protein, ur: 30 mg/dL — AB
Specific Gravity, Urine: 1.02 (ref 1.005–1.030)
pH: 5 (ref 5.0–8.0)

## 2020-09-11 LAB — COMPREHENSIVE METABOLIC PANEL
ALT: 54 U/L — ABNORMAL HIGH (ref 0–44)
AST: 186 U/L — ABNORMAL HIGH (ref 15–41)
Albumin: 3.2 g/dL — ABNORMAL LOW (ref 3.5–5.0)
Alkaline Phosphatase: 195 U/L — ABNORMAL HIGH (ref 38–126)
Anion gap: 15 (ref 5–15)
BUN: <5 mg/dL — ABNORMAL LOW (ref 6–20)
CO2: 26 mmol/L (ref 22–32)
Calcium: 9.1 mg/dL (ref 8.9–10.3)
Chloride: 96 mmol/L — ABNORMAL LOW (ref 98–111)
Creatinine, Ser: 0.63 mg/dL (ref 0.44–1.00)
GFR, Estimated: >60 mL/min (ref >60)
Glucose, Bld: 119 mg/dL — ABNORMAL HIGH (ref 70–99)
Potassium: 3 mmol/L — ABNORMAL LOW (ref 3.5–5.1)
Sodium: 137 mmol/L (ref 135–145)
Total Bilirubin: 1.7 mg/dL — ABNORMAL HIGH (ref 0.3–1.2)
Total Protein: 7 g/dL (ref 6.5–8.1)

## 2020-09-11 LAB — CBC WITH DIFFERENTIAL/PLATELET
Abs Immature Granulocytes: 0.02 10*3/uL (ref 0.00–0.07)
Basophils Absolute: 0.1 10*3/uL (ref 0.0–0.1)
Basophils Relative: 1 %
Eosinophils Absolute: 0.1 10*3/uL (ref 0.0–0.5)
Eosinophils Relative: 2 %
HCT: 32.9 % — ABNORMAL LOW (ref 36.0–46.0)
Hemoglobin: 11.8 g/dL — ABNORMAL LOW (ref 12.0–15.0)
Immature Granulocytes: 0 %
Lymphocytes Relative: 22 %
Lymphs Abs: 1.5 10*3/uL (ref 0.7–4.0)
MCH: 37.2 pg — ABNORMAL HIGH (ref 26.0–34.0)
MCHC: 35.9 g/dL (ref 30.0–36.0)
MCV: 103.8 fL — ABNORMAL HIGH (ref 80.0–100.0)
Monocytes Absolute: 0.8 10*3/uL (ref 0.1–1.0)
Monocytes Relative: 12 %
Neutro Abs: 4.1 10*3/uL (ref 1.7–7.7)
Neutrophils Relative %: 63 %
Platelets: 387 10*3/uL (ref 150–400)
RBC: 3.17 MIL/uL — ABNORMAL LOW (ref 3.87–5.11)
RDW: 20.6 % — ABNORMAL HIGH (ref 11.5–15.5)
WBC: 6.6 10*3/uL (ref 4.0–10.5)
nRBC: 0 % (ref 0.0–0.2)

## 2020-09-11 LAB — LACTIC ACID, PLASMA
Lactic Acid, Venous: 4.3 mmol/L (ref 0.5–1.9)
Lactic Acid, Venous: 2.7 mmol/L (ref 0.5–1.9)

## 2020-09-11 LAB — RESP PANEL BY RT-PCR (FLU A&B, COVID) ARPGX2
Influenza A by PCR: NEGATIVE
Influenza B by PCR: NEGATIVE
SARS Coronavirus 2 by RT PCR: NEGATIVE

## 2020-09-11 LAB — APTT: aPTT: 25 seconds (ref 24–36)

## 2020-09-11 LAB — POC URINE PREG, ED: Preg Test, Ur: NEGATIVE

## 2020-09-11 LAB — HCG, QUANTITATIVE, PREGNANCY: hCG, Beta Chain, Quant, S: 1 m[IU]/mL (ref <5)

## 2020-09-11 MED ORDER — SODIUM CHLORIDE 0.9 % IV SOLN
2.0000 g | INTRAVENOUS | Status: DC
  Administered 2020-09-11: 2 g via INTRAVENOUS
  Filled 2020-09-11 (×2): qty 20

## 2020-09-11 MED ORDER — KETOROLAC TROMETHAMINE 30 MG/ML IJ SOLN
30.0000 mg | Freq: Four times a day (QID) | INTRAMUSCULAR | Status: DC | PRN
  Administered 2020-09-12 – 2020-09-13 (×2): 30 mg via INTRAVENOUS
  Filled 2020-09-11 (×2): qty 1

## 2020-09-11 MED ORDER — THIAMINE HCL 100 MG PO TABS
100.0000 mg | ORAL_TABLET | Freq: Every day | ORAL | Status: DC
  Administered 2020-09-12 – 2020-09-13 (×2): 100 mg via ORAL
  Filled 2020-09-11 (×2): qty 1

## 2020-09-11 MED ORDER — MORPHINE SULFATE (PF) 4 MG/ML IV SOLN
4.0000 mg | Freq: Once | INTRAVENOUS | Status: AC
Start: 2020-09-11 — End: 2020-09-11
  Administered 2020-09-11: 4 mg via INTRAVENOUS
  Filled 2020-09-11: qty 1

## 2020-09-11 MED ORDER — ACETAMINOPHEN 650 MG RE SUPP: 650.0000 mg | Freq: Four times a day (QID) | RECTAL | Status: DC | PRN

## 2020-09-11 MED ORDER — SODIUM CHLORIDE 0.9 % IV SOLN
2.0000 g | INTRAVENOUS | Status: DC
  Administered 2020-09-12: 2 g via INTRAVENOUS
  Filled 2020-09-11 (×3): qty 20

## 2020-09-11 MED ORDER — LACTATED RINGERS IV BOLUS
1000.0000 mL | Freq: Once | INTRAVENOUS | Status: AC
  Administered 2020-09-11: 1000 mL via INTRAVENOUS

## 2020-09-11 MED ORDER — ADULT MULTIVITAMIN W/MINERALS CH
1.0000 | ORAL_TABLET | Freq: Every day | ORAL | Status: DC
  Administered 2020-09-12 – 2020-09-13 (×2): 1 via ORAL
  Filled 2020-09-11 (×2): qty 1

## 2020-09-11 MED ORDER — HYDROCODONE-ACETAMINOPHEN 5-325 MG PO TABS
1.0000 | ORAL_TABLET | ORAL | Status: DC | PRN
  Administered 2020-09-11 – 2020-09-12 (×4): 2 via ORAL
  Filled 2020-09-11 (×4): qty 2

## 2020-09-11 MED ORDER — SODIUM CHLORIDE 0.9 % IV BOLUS
1000.0000 mL | Freq: Once | INTRAVENOUS | Status: AC
  Administered 2020-09-11: 1000 mL via INTRAVENOUS

## 2020-09-11 MED ORDER — ACETAMINOPHEN 325 MG PO TABS
650.0000 mg | ORAL_TABLET | Freq: Four times a day (QID) | ORAL | Status: DC | PRN
  Administered 2020-09-13: 650 mg via ORAL
  Filled 2020-09-11: qty 2

## 2020-09-11 MED ORDER — ONDANSETRON HCL 4 MG/2ML IJ SOLN
4.0000 mg | Freq: Once | INTRAMUSCULAR | Status: AC
  Administered 2020-09-11: 4 mg via INTRAVENOUS
  Filled 2020-09-11: qty 2

## 2020-09-11 MED ORDER — FOLIC ACID 1 MG PO TABS
1.0000 mg | ORAL_TABLET | Freq: Every day | ORAL | Status: DC
  Administered 2020-09-12 – 2020-09-13 (×2): 1 mg via ORAL
  Filled 2020-09-11 (×2): qty 1

## 2020-09-11 MED ORDER — LORAZEPAM 2 MG/ML IJ SOLN
1.0000 mg | INTRAMUSCULAR | Status: DC | PRN
  Filled 2020-09-11: qty 1

## 2020-09-11 MED ORDER — SODIUM CHLORIDE 0.9 % IV SOLN: INTRAVENOUS | Status: DC

## 2020-09-11 MED ORDER — ENOXAPARIN SODIUM 40 MG/0.4ML IJ SOSY
40.0000 mg | PREFILLED_SYRINGE | INTRAMUSCULAR | Status: DC
  Administered 2020-09-12: 22:00:00 40 mg via SUBCUTANEOUS
  Filled 2020-09-11 (×2): qty 0.4

## 2020-09-11 MED ORDER — LORAZEPAM 1 MG PO TABS
1.0000 mg | ORAL_TABLET | ORAL | Status: DC | PRN
  Administered 2020-09-12: 2 mg via ORAL
  Filled 2020-09-11: qty 2

## 2020-09-11 MED ORDER — LORAZEPAM 2 MG PO TABS: 0.0000 mg | ORAL_TABLET | Freq: Four times a day (QID) | ORAL | Status: DC

## 2020-09-11 MED ORDER — THIAMINE HCL 100 MG/ML IJ SOLN: 100.0000 mg | Freq: Every day | INTRAMUSCULAR | Status: DC

## 2020-09-11 MED ORDER — LORAZEPAM 2 MG PO TABS: 0.0000 mg | ORAL_TABLET | Freq: Two times a day (BID) | ORAL | Status: DC

## 2020-09-11 MED ORDER — POTASSIUM CHLORIDE 10 MEQ/100ML IV SOLN
10.0000 meq | INTRAVENOUS | Status: AC
Start: 2020-09-11 — End: 2020-09-11
  Administered 2020-09-11 (×3): 10 meq via INTRAVENOUS
  Filled 2020-09-11 (×3): qty 100

## 2020-09-11 NOTE — ED Notes (Signed)
IV flushed after morphine.

## 2020-09-11 NOTE — Progress Notes (Signed)
Notified bedside nurse of need to draw repeat lactic acid. 

## 2020-09-11 NOTE — Progress Notes (Signed)
Elink following for code sepsis 

## 2020-09-11 NOTE — ED Notes (Signed)
MD Katrinka Blazing informed of pt's lactic result of 4.3

## 2020-09-11 NOTE — ED Notes (Signed)
IV flushed between rocephin and zofran.

## 2020-09-11 NOTE — ED Triage Notes (Signed)
Pt via POV from home. Pt c/o mid back pain since Thursday. Pt was seen and d/c recently and dx with a UTI, they gave her antibiotics but she states the pain is getting worse and now she is having generalized abd pain. Pt is still taking the antibiotic currently. Pt states she still is having frequency and urgency. Denies NVD. Pt A&Ox4 and NAD.

## 2020-09-11 NOTE — H&P (Signed)
History and Physical    Hailey Castaneda:[id number] DOB: January 17, 1986 DOA: 09/11/2020  PCP: Jon Billings, NP   Patient coming from:   I have personally briefly reviewed patient's old medical records in [place]  Chief Complaint: abdominal pain  HPI: Hailey Castaneda is a 35 y.o. female with medical history significant for Bipolar mood disorder, alcohol use disorder and neuropathy, presenting with a several day history of mid back pain nonradiating to flanks, right greater than left, of increasing intensity and associated with frequency and urgency.  Recently diagnosed with a UTI and was given antibiotics but symptoms have been worsening in spite of treatment.  She has nausea without vomiting.  Denies fever or chills or diarrhea.  Has no cough chest pain or shortness of breath. Does complain of numbness of the face and legs for the past one day.   ED course: On arrival afebrile, BP 133/103 pulse 122, respirations 18 with O2 sat 99% on room air Blood work significant for normal WBC of 6600 but with lactic acid 4.3-2.7.  Hemoglobin 11.8 and potassium 3.0.  Elevated LFTs with AST/ALT 186/54 with alk phos 195 and bilirubin 1.7 Urinalysis with pyuria.  COVID and flu negative.  EKG, personally viewed and interpreted: Sinus tachycardia at 120 with nonspecific ST-T wave changes  Imaging: CT abdomen and pelvis with hepatic steatosis otherwise no acute findings Right upper quadrant ultrasound: Diffuse fatty infiltration of the liver.  No evidence of cholelithiasis or cholecystitis  Patient given an IV fluid bolus.  Started on Rocephin.  Hospitalist consulted for admission.   Review of Systems: As per HPI otherwise all other systems on review of systems negative.    Past Medical History:  Diagnosis Date   Anxiety    Bipolar 1 disorder ([place])    Sinus disorder     Past Surgical History:  Procedure Laterality Date   lymph node removal     chain of 9, beign      reports that she  quit smoking about 11 years ago. Her smoking use included cigarettes. She has never used smokeless tobacco. She reports current alcohol use. She reports that she does not use drugs.  Allergies  Allergen Reactions   Azithromycin Other (See Comments)   Penicillins Other (See Comments)    Family History  Problem Relation Age of Onset   Fibromyalgia Mother    Heart disease Mother    Hypertension Mother    COPD Mother    Heart attack Mother    Cancer Maternal Grandmother        breast and ovarian   Heart disease Maternal Grandmother    Hypertension Maternal Grandmother    Osteopenia Paternal Grandmother       Prior to Admission medications   Medication Sig Start Date End Date Taking? Authorizing Provider  ALPRAZolam Duanne Moron) 0.5 MG tablet Take by mouth. 04/14/17   [provider]  cetirizine (ZYRTEC) 10 MG tablet Take by mouth. 02/20/17   [provider]  fluticasone (FLONASE) 50 MCG/ACT nasal spray Place into the nose. 02/20/17   [provider]  hydrOXYzine (ATARAX/VISTARIL) 25 MG tablet Take by mouth. 04/20/19   [provider]  lamoTRIgine (LAMICTAL) 150 MG tablet Take 225 mg by mouth. 02/20/17   [provider]  TALTZ 80 MG/ML SOAJ Inject into the skin. 06/01/20   [provider]    Physical Exam: Vitals:   09/11/20 1709 09/11/20 1730 09/11/20 1832 09/11/20 1941  BP: (!) 131/95 (!) 156/113 Marland Kitchen)  154/111 (!) 147/109  Pulse: (!) 107 (!) 104 (!) 102 (!) 102  Resp: _0 Temp:      TempSrc:      SpO2: 100% 100% 99% 96%  Weight:      Height:         Vitals:   09/11/20 1709 09/11/20 1730 09/11/20 1832 09/11/20 1941  BP: (!) 131/95 (!) 156/113 (!) 154/111 (!) 147/109  Pulse: (!) 107 (!) 104 (!) 102 (!) 102  Resp: _1 Temp:      TempSrc:      SpO2: 100% 100% 99% 96%  Weight:      Height:          Constitutional: Alert and oriented x 3 . Not in any apparent distress HEENT:      Head: Normocephalic and  atraumatic.         Eyes: PERLA, EOMI, Conjunctivae are normal. Sclera is non-icteric.       Mouth/Throat: Mucous membranes are moist.       Neck: Supple with no signs of meningismus. Cardiovascular: Regular rate and rhythm. No murmurs, gallops, or rubs. 2+ symmetrical distal pulses are present . No JVD. No LE edema Respiratory: Respiratory effort normal .Lungs sounds clear bilaterally. No wheezes, crackles, or rhonchi.  Gastrointestinal: Soft, non tender, and non distended with positive bowel sounds.  Genitourinary: No CVA tenderness. Musculoskeletal: Nontender with normal range of motion in all extremities. No cyanosis, or erythema of extremities. Neurologic:  Face is symmetric. Moving all extremities. No gross focal neurologic deficits . Skin: Skin is warm, dry.  No rash or ulcers Psychiatric: Mood and affect are normal    Labs on Admission: I have personally reviewed following labs and imaging studies  CBC: Recent Labs  Lab 09/11/20 1442  WBC 6.6  NEUTROABS 4.1  HGB 11.8*  HCT 32.9*  MCV 103.8*  PLT 016   Basic Metabolic Panel: Recent Labs  Lab 09/11/20 1442  NA 137  K 3.0*  CL 96*  CO2 26  GLUCOSE 119*  BUN <5*  CREATININE 0.63  CALCIUM 9.1   GFR: Estimated Creatinine Clearance: 87.1 mL/min (by C-G formula based on SCr of 0.63 mg/dL). Liver Function Tests: Recent Labs  Lab 09/11/20 1442  AST 186*  ALT 54*  ALKPHOS 195*  BILITOT 1.7*  PROT 7.0  ALBUMIN 3.2*   No results for input(s): LIPASE, AMYLASE in the last 168 hours. No results for input(s): AMMONIA in the last 168 hours. Coagulation Profile: Recent Labs  Lab 09/11/20 1552  INR 1.0   Cardiac Enzymes: No results for input(s): CKTOTAL, CKMB, CKMBINDEX, TROPONINI in the last 168 hours. BNP (last 3 results) No results for input(s): PROBNP in the last 8760 hours. HbA1C: No results for input(s): HGBA1C in the last 72 hours. CBG: No results for input(s): GLUCAP in the last 168 hours. Lipid  Profile: No results for input(s): CHOL, HDL, LDLCALC, TRIG, CHOLHDL, LDLDIRECT in the last 72 hours. Thyroid Function Tests: No results for input(s): TSH, T4TOTAL, FREET4, T3FREE, THYROIDAB in the last 72 hours. Anemia Panel: No results for input(s): VITAMINB12, FOLATE, FERRITIN, TIBC, IRON, RETICCTPCT in the last 72 hours. Urine analysis:    Component Value Date/Time   COLORURINE RED (A) 09/11/2020 1448   APPEARANCEUR CLEAR (A) 09/11/2020 1448   LABSPEC 1.020 09/11/2020 1448   PHURINE 5.0 09/11/2020 1448   GLUCOSEU NEGATIVE 09/11/2020 1448   HGBUR NEGATIVE 09/11/2020 1448   BILIRUBINUR NEGATIVE 09/11/2020 1448  KETONESUR NEGATIVE 09/11/2020 1448   PROTEINUR 30 (A) 09/11/2020 1448   NITRITE POSITIVE (A) 09/11/2020 1448   LEUKOCYTESUR NEGATIVE 09/11/2020 1448    Radiological Exams on Admission: CT ABDOMEN PELVIS WO CONTRAST  Result Date: 09/11/2020 CLINICAL DATA:  Abdominal pain EXAM: CT ABDOMEN AND PELVIS WITHOUT CONTRAST TECHNIQUE: Multidetector CT imaging of the abdomen and pelvis was performed following the standard protocol without IV contrast. COMPARISON:  None. FINDINGS: Lower chest: Lung bases are clear. No effusions. Heart is normal size. Hepatobiliary: Diffuse low-density throughout the liver compatible with fatty infiltration. No focal abnormality. Gallbladder unremarkable. Pancreas: No focal abnormality or ductal dilatation. Spleen: No focal abnormality.  Normal size. Adrenals/Urinary Tract: No adrenal abnormality. No focal renal abnormality. No stones or hydronephrosis. Urinary bladder is unremarkable. Stomach/Bowel: Stomach, large and small bowel grossly unremarkable. Normal appendix. Vascular/Lymphatic: No evidence of aneurysm or adenopathy. Reproductive: Uterus and adnexa unremarkable.  No mass. Other: No free fluid or free air. Musculoskeletal: No acute bony abnormality. IMPRESSION: Hepatic steatosis. No acute findings in the abdomen or pelvis. Electronically Signed   By:  Rolm Baptise M.D.   On: 09/11/2020 18:04   US Abdomen Limited RUQ (LIVER/GB)  Result Date: 09/11/2020 CLINICAL DATA:  Right upper quadrant pain for 5 days EXAM: ULTRASOUND ABDOMEN LIMITED RIGHT UPPER QUADRANT COMPARISON:  CT 09/11/2020 FINDINGS: Gallbladder: No gallstones or wall thickening visualized. No sonographic Murphy sign noted by sonographer. Common bile duct: Diameter: 2 mm, normal Liver: Diffusely increased hepatic parenchymal echotexture consistent with diffuse fatty infiltration. No focal lesions identified. Portal vein is patent on color Doppler imaging with normal direction of blood flow towards the liver. Other: None. IMPRESSION: Diffuse fatty infiltration of the liver. No evidence of cholelithiasis or cholecystitis. Electronically Signed   By: Lucienne Capers M.D.   On: 09/11/2020 18:19     Assessment/Plan 35 year old female with history of bipolar mood disorder and alcohol use disorder presenting with a several day history of abdominal pain not improving with antibiotics for treatment of UTI.      Sepsis secondary to UTI [facility]) -Patient with tachycardia normal WBC in the setting of recent antibiotics elevated lactic acid 4.3-2.7 and pyuria on UA, - LFTs elevated but no evidence of cholecystitis on RUQ ultrasound - Continue IV fluids - Continue Rocephin - Follow cultures    Hypokalemia - Potassium of 3 - Oral repletion given in the ED - Continue to monitor    Alcohol use disorder, moderate, dependence ([place]) - CIWA withdrawal protocol - Counseled on abstinence/cutting back --thiamin folate and MVI    Hepatic steatosis/elevated LFTs -AST/ALT 186/54 with alk phos 195 and bilirubin 1.7 - Likely alcohol related - Right upper quadrant sonogram with no cholelithiasis or cholecystitis - Can consider HIDA scan  Macrocytic anemia --Hb 11.8 with MCV 103 --neuropathic symptoms of numbness --Likely related to alcohol use --follow anemia panel    Bipolar 1 disorder ([place]) -  Continue home meds pending med rec    DVT prophylaxis: Lovenox  Code Status: full code  Family Communication:  none  Disposition Plan: Back to previous home environment Consults called: none  Status:observation    Athena Masse MD [facility]     09/11/2020, 7:43 PM

## 2020-09-11 NOTE — Progress Notes (Signed)
CODE SEPSIS - PHARMACY COMMUNICATION  **Broad Spectrum Antibiotics should be administered within 1 hour of Sepsis diagnosis**  Time Code Sepsis Called/Page Received: 1708  Antibiotics Ordered: ceftriaxone  Time of 1st antibiotic administration: 1729   Pricilla Riffle, PharmD, BCPS Clinical Pharmacist 09/11/2020 5:54 PM

## 2020-09-11 NOTE — ED Provider Notes (Signed)
[facility] Emergency Department Provider Note  ____________________________________________   Event Date/Time   First MD Initiated Contact with Patient 09/11/20 1701     (approximate)  I have reviewed the triage vital signs and the nursing notes.   HISTORY  Chief Complaint Back Pain    HPI Hailey Castaneda is a 35 y.o. female  with h/o bipolar disorder, reported EtOH abuse, here with nausea, dysuria, abd pain, fatigue. Pt reports that for the past several days,s he has had intermittent, but progressively worsening suprapubic as well as generalized abd pain, along with nausea, poor appetite. She went to UC and was given macrobid which she has been taking for possible UTI, but this has not helped her sx. She's now developed R>L back pain along with fatigue. She's also had worsening of what she describes as numbness in her bl hands, feet and face, though this has been a somewhat chronic/longer-term issue. She reports she recently was referred to a GI specialist and Rheumatologist for weight loss, elevated LFTs, possible lupus. She has also been referred to a Cardiologist. Denies any current CP, SOB. Pain/discomfort is worse w/ eating, palpation. No alleviating factors.        Past Medical History:  Diagnosis Date   Anxiety    Bipolar 1 disorder (HCC)    Sinus disorder     Patient Active Problem List   Diagnosis Date Noted   Alcohol abuse 09/22/2018   Psoriasis 09/22/2018   Bipolar 1 disorder (HCC) 04/17/2015   GAD (generalized anxiety disorder) 04/17/2015    Past Surgical History:  Procedure Laterality Date   lymph node removal     chain of 9, beign     Prior to Admission medications   Medication Sig Start Date End Date Taking? Authorizing Provider  ALPRAZolam Prudy Feeler(XANAX) 0.5 MG tablet Take by mouth. 04/14/17   [provider]  cetirizine (ZYRTEC) 10 MG tablet Take by mouth. 02/20/17   [provider]  fluticasone (FLONASE) 50 MCG/ACT  nasal spray Place into the nose. 02/20/17   [provider]  hydrOXYzine (ATARAX/VISTARIL) 25 MG tablet Take by mouth. 04/20/19   [provider]  lamoTRIgine (LAMICTAL) 150 MG tablet Take 225 mg by mouth. 02/20/17   [provider]  TALTZ 80 MG/ML SOAJ Inject into the skin. 06/01/20   [provider]    Allergies Azithromycin and Penicillins  Family History  Problem Relation Age of Onset   Fibromyalgia Mother    Heart disease Mother    Hypertension Mother    COPD Mother    Heart attack Mother    Cancer Maternal Grandmother        breast and ovarian   Heart disease Maternal Grandmother    Hypertension Maternal Grandmother    Osteopenia Paternal Grandmother     Social History Social History   Tobacco Use   Smoking status: Former    Types: Cigarettes    Quit date: 2011    Years since quitting: 11.6   Smokeless tobacco: Never  Vaping Use   Vaping Use: Never used  Substance Use Topics   Alcohol use: Yes    Comment: social   Drug use: Never    Review of Systems  Review of Systems  Constitutional:  Positive for fatigue. Negative for fever.  HENT:  Negative for congestion and sore throat.   Eyes:  Negative for visual disturbance.  Respiratory:  Negative for cough and shortness of breath.   Cardiovascular:  Negative for chest  pain.  Gastrointestinal:  Positive for abdominal pain and nausea. Negative for diarrhea and vomiting.  Genitourinary:  Positive for dysuria and flank pain.  Musculoskeletal:  Negative for back pain and neck pain.  Skin:  Negative for rash and wound.  Neurological:  Positive for weakness.  All other systems reviewed and are negative.   ____________________________________________  PHYSICAL EXAM:      VITAL SIGNS: ED Triage Vitals  Enc Vitals Group     BP 09/11/20 1430 (!) 133/103     Pulse Rate 09/11/20 1430 (!) 122     Resp 09/11/20 1430 18     Temp 09/11/20 1430 98.7 F (37.1 C)     Temp Source 09/11/20  1430 Oral     SpO2 09/11/20 1430 99 %     Weight 09/11/20 1431 124 lb (56.2 kg)     Height 09/11/20 1431 5\' 5"  (1.651 m)     Head Circumference --      Peak Flow --      Pain Score 09/11/20 1431 7     Pain Loc --      Pain Edu? --      Excl. in GC? --      Physical Exam Vitals and nursing note reviewed.  Constitutional:      General: She is not in acute distress.    Appearance: She is well-developed.  HENT:     Head: Normocephalic and atraumatic.     Mouth/Throat:     Mouth: Mucous membranes are dry.  Eyes:     Conjunctiva/sclera: Conjunctivae normal.  Cardiovascular:     Rate and Rhythm: Normal rate and regular rhythm.     Heart sounds: Normal heart sounds. No murmur heard.   No friction rub.  Pulmonary:     Effort: Pulmonary effort is normal. No respiratory distress.     Breath sounds: Normal breath sounds. No wheezing or rales.  Abdominal:     General: Abdomen is flat. There is no distension.     Palpations: Abdomen is soft.     Tenderness: There is no abdominal tenderness.  Musculoskeletal:     Cervical back: Neck supple.  Skin:    General: Skin is warm.     Capillary Refill: Capillary refill takes less than 2 seconds.  Neurological:     Mental Status: She is alert and oriented to person, place, and time.     Motor: No abnormal muscle tone.      ____________________________________________   LABS (all labs ordered are listed, but only abnormal results are displayed)  Labs Reviewed  LACTIC ACID, PLASMA - Abnormal; Notable for the following components:      Result Value   Lactic Acid, Venous 4.3 (*)    All other components within normal limits  LACTIC ACID, PLASMA - Abnormal; Notable for the following components:   Lactic Acid, Venous 2.7 (*)    All other components within normal limits  COMPREHENSIVE METABOLIC PANEL - Abnormal; Notable for the following components:   Potassium 3.0 (*)    Chloride 96 (*)    Glucose, Bld 119 (*)    BUN <5 (*)     Albumin 3.2 (*)    AST 186 (*)    ALT 54 (*)    Alkaline Phosphatase 195 (*)    Total Bilirubin 1.7 (*)    All other components within normal limits  CBC WITH DIFFERENTIAL/PLATELET - Abnormal; Notable for the following components:   RBC 3.17 (*)    Hemoglobin  11.8 (*)    HCT 32.9 (*)    MCV 103.8 (*)    MCH 37.2 (*)    RDW 20.6 (*)    All other components within normal limits  URINALYSIS, COMPLETE (UACMP) WITH MICROSCOPIC - Abnormal; Notable for the following components:   Color, Urine RED (*)    APPearance CLEAR (*)    Protein, ur 30 (*)    Nitrite POSITIVE (*)    Bacteria, UA FEW (*)    All other components within normal limits  RESP PANEL BY RT-PCR (FLU A&B, COVID) ARPGX2  CULTURE, BLOOD (SINGLE)  URINE CULTURE  PROTIME-INR  APTT  HCG, QUANTITATIVE, PREGNANCY  POC URINE PREG, ED    ____________________________________________  EKG: Sinus tachycardia, ventricular rate 120.  PR 120, QRS 80, QTc 466.  No specific ST elevations or depressions. ________________________________________  RADIOLOGY All imaging, including plain films, CT scans, and ultrasounds, independently reviewed by me, and interpretations confirmed via formal radiology reads.  ED MD interpretation:   CT abdomen/pelvis: No acute abnormality, hepatic steatosis Event ultrasound: Diffuse fatty infiltration of the liver  Official radiology report(s): CT ABDOMEN PELVIS WO CONTRAST  Result Date: 09/11/2020 CLINICAL DATA:  Abdominal pain EXAM: CT ABDOMEN AND PELVIS WITHOUT CONTRAST TECHNIQUE: Multidetector CT imaging of the abdomen and pelvis was performed following the standard protocol without IV contrast. COMPARISON:  None. FINDINGS: Lower chest: Lung bases are clear. No effusions. Heart is normal size. Hepatobiliary: Diffuse low-density throughout the liver compatible with fatty infiltration. No focal abnormality. Gallbladder unremarkable. Pancreas: No focal abnormality or ductal dilatation. Spleen: No focal  abnormality.  Normal size. Adrenals/Urinary Tract: No adrenal abnormality. No focal renal abnormality. No stones or hydronephrosis. Urinary bladder is unremarkable. Stomach/Bowel: Stomach, large and small bowel grossly unremarkable. Normal appendix. Vascular/Lymphatic: No evidence of aneurysm or adenopathy. Reproductive: Uterus and adnexa unremarkable.  No mass. Other: No free fluid or free air. Musculoskeletal: No acute bony abnormality. IMPRESSION: Hepatic steatosis. No acute findings in the abdomen or pelvis. Electronically Signed   By: Charlett Nose M.D.   On: 09/11/2020 18:04   US Abdomen Limited RUQ (LIVER/GB)  Result Date: 09/11/2020 CLINICAL DATA:  Right upper quadrant pain for 5 days EXAM: ULTRASOUND ABDOMEN LIMITED RIGHT UPPER QUADRANT COMPARISON:  CT 09/11/2020 FINDINGS: Gallbladder: No gallstones or wall thickening visualized. No sonographic Murphy sign noted by sonographer. Common bile duct: Diameter: 2 mm, normal Liver: Diffusely increased hepatic parenchymal echotexture consistent with diffuse fatty infiltration. No focal lesions identified. Portal vein is patent on color Doppler imaging with normal direction of blood flow towards the liver. Other: None. IMPRESSION: Diffuse fatty infiltration of the liver. No evidence of cholelithiasis or cholecystitis. Electronically Signed   By: Burman Nieves M.D.   On: 09/11/2020 18:19    ____________________________________________  PROCEDURES   Procedure(s) performed (including Critical Care):  .Critical Care  Date/Time: 09/11/2020 7:25 PM Performed by: Shaune Pollack, MD Authorized by: Shaune Pollack, MD   Critical care provider statement:    Critical care time (minutes):  35   Critical care time was exclusive of:  Separately billable procedures and treating other patients and teaching time   Critical care was necessary to treat or prevent imminent or life-threatening deterioration of the following conditions:  Circulatory failure,  sepsis and cardiac failure   Critical care was time spent personally by me on the following activities:  Development of treatment plan with patient or surrogate, discussions with consultants, evaluation of patient's response to treatment, examination of patient, obtaining history from patient  or surrogate, ordering and performing treatments and interventions, ordering and review of laboratory studies, ordering and review of radiographic studies, pulse oximetry, re-evaluation of patient's condition and review of old charts   I assumed direction of critical care for this patient from another provider in my specialty: no    ____________________________________________  INITIAL IMPRESSION / MDM / ASSESSMENT AND PLAN / ED COURSE  As part of my medical decision making, I reviewed the following data within the electronic MEDICAL RECORD NUMBER Nursing notes reviewed and incorporated, Old chart reviewed, Notes from prior ED visits, and [place] Controlled Substance Database       *SHAMIEKA Castaneda was evaluated in Emergency Department on 09/11/2020 for the symptoms described in the history of present illness. She was evaluated in the context of the global COVID-19 pandemic, which necessitated consideration that the patient might be at risk for infection with the SARS-CoV-2 virus that causes COVID-19. Institutional protocols and algorithms that pertain to the evaluation of patients at risk for COVID-19 are in a state of rapid change based on information released by regulatory bodies including the CDC and federal and state organizations. These policies and algorithms were followed during the patient's care in the ED.  Some ED evaluations and interventions may be delayed as a result of limited staffing during the pandemic.*     Medical Decision Making: 35 year old female here with generalized fatigue, subjective fevers, nausea, dysuria and urinary sediment.  On arrival, patient tachycardic, with lactic acid of 4 and symptoms  of UTI concerning for sepsis due to UTI.  Sepsis protocol initiated with Rocephin and fluids.  Patient also has transaminitis.  She does admit to fairly regular alcohol use but also has elevated bilirubin and persistent elevation based on previous labs.  Right upper quadrant ultrasound and CT obtained.  Patient has hepatic steatosis but no evidence of cirrhosis or mass or lesion.  CT abdomen and pelvis otherwise negative.  UA reviewed does show positive nitrites and pyuria with few bacteria.  Patient given Rocephin, admit to medicine.  Blood culture sent.  ____________________________________________  FINAL CLINICAL IMPRESSION(S) / ED DIAGNOSES  Final diagnoses:  RUQ pain  Sepsis secondary to UTI (HCC)  Transaminitis     MEDICATIONS GIVEN DURING THIS VISIT:  Medications  cefTRIAXone (ROCEPHIN) 2 g in sodium chloride 0.9 % 100 mL IVPB (0 g Intravenous Stopped 09/11/20 1833)  potassium chloride 10 mEq in 100 mL IVPB (10 mEq Intravenous New Bag/Given 09/11/20 1854)  sodium chloride 0.9 % bolus 1,000 mL (0 mLs Intravenous Stopped 09/11/20 1709)  lactated ringers bolus 1,000 mL (0 mLs Intravenous Stopped 09/11/20 1724)  morphine 4 MG/ML injection 4 mg (4 mg Intravenous Given 09/11/20 1806)  ondansetron (ZOFRAN) injection 4 mg (4 mg Intravenous Given 09/11/20 1805)  sodium chloride 0.9 % bolus 1,000 mL (1,000 mLs Intravenous New Bag/Given 09/11/20 1727)     ED Discharge Orders     None        Note:  This document was prepared using Dragon voice recognition software and may include unintentional dictation errors.   Shaune Pollack, MD 09/11/20 [phone]

## 2020-09-12 DIAGNOSIS — G629 Polyneuropathy, unspecified: Secondary | ICD-10-CM | POA: Diagnosis present

## 2020-09-12 DIAGNOSIS — D539 Nutritional anemia, unspecified: Secondary | ICD-10-CM | POA: Diagnosis present

## 2020-09-12 DIAGNOSIS — K760 Fatty (change of) liver, not elsewhere classified: Secondary | ICD-10-CM | POA: Diagnosis present

## 2020-09-12 DIAGNOSIS — M549 Dorsalgia, unspecified: Secondary | ICD-10-CM | POA: Diagnosis present

## 2020-09-12 DIAGNOSIS — Z880 Allergy status to penicillin: Secondary | ICD-10-CM | POA: Diagnosis not present

## 2020-09-12 DIAGNOSIS — E860 Dehydration: Secondary | ICD-10-CM | POA: Diagnosis present

## 2020-09-12 DIAGNOSIS — A419 Sepsis, unspecified organism: Secondary | ICD-10-CM | POA: Diagnosis present

## 2020-09-12 DIAGNOSIS — Z79899 Other long term (current) drug therapy: Secondary | ICD-10-CM | POA: Diagnosis not present

## 2020-09-12 DIAGNOSIS — Z20822 Contact with and (suspected) exposure to covid-19: Secondary | ICD-10-CM | POA: Diagnosis present

## 2020-09-12 DIAGNOSIS — R1011 Right upper quadrant pain: Secondary | ICD-10-CM | POA: Diagnosis present

## 2020-09-12 DIAGNOSIS — F419 Anxiety disorder, unspecified: Secondary | ICD-10-CM | POA: Diagnosis present

## 2020-09-12 DIAGNOSIS — Z881 Allergy status to other antibiotic agents status: Secondary | ICD-10-CM | POA: Diagnosis not present

## 2020-09-12 DIAGNOSIS — F319 Bipolar disorder, unspecified: Secondary | ICD-10-CM | POA: Diagnosis present

## 2020-09-12 DIAGNOSIS — E538 Deficiency of other specified B group vitamins: Secondary | ICD-10-CM | POA: Diagnosis present

## 2020-09-12 DIAGNOSIS — N390 Urinary tract infection, site not specified: Secondary | ICD-10-CM | POA: Diagnosis present

## 2020-09-12 DIAGNOSIS — E876 Hypokalemia: Secondary | ICD-10-CM | POA: Diagnosis present

## 2020-09-12 DIAGNOSIS — Z87891 Personal history of nicotine dependence: Secondary | ICD-10-CM | POA: Diagnosis not present

## 2020-09-12 LAB — IRON AND TIBC
Iron: 185 ug/dL — ABNORMAL HIGH (ref 28–170)
Saturation Ratios: 89 % — ABNORMAL HIGH (ref 10.4–31.8)
TIBC: 207 ug/dL — ABNORMAL LOW (ref 250–450)
UIBC: 22 ug/dL

## 2020-09-12 LAB — HIV ANTIBODY (ROUTINE TESTING W REFLEX): HIV Screen 4th Generation wRfx: NONREACTIVE

## 2020-09-12 LAB — CBC
HCT: 28.3 % — ABNORMAL LOW (ref 36.0–46.0)
Hemoglobin: 9.7 g/dL — ABNORMAL LOW (ref 12.0–15.0)
MCH: 35.9 pg — ABNORMAL HIGH (ref 26.0–34.0)
MCHC: 34.3 g/dL (ref 30.0–36.0)
MCV: 104.8 fL — ABNORMAL HIGH (ref 80.0–100.0)
Platelets: 270 10*3/uL (ref 150–400)
RBC: 2.7 MIL/uL — ABNORMAL LOW (ref 3.87–5.11)
RDW: 20.7 % — ABNORMAL HIGH (ref 11.5–15.5)
WBC: 4.5 10*3/uL (ref 4.0–10.5)
nRBC: 0 % (ref 0.0–0.2)

## 2020-09-12 LAB — BASIC METABOLIC PANEL
Anion gap: 10 (ref 5–15)
BUN: <5 mg/dL — ABNORMAL LOW (ref 6–20)
CO2: 27 mmol/L (ref 22–32)
Calcium: 8.1 mg/dL — ABNORMAL LOW (ref 8.9–10.3)
Chloride: 101 mmol/L (ref 98–111)
Creatinine, Ser: 0.55 mg/dL (ref 0.44–1.00)
GFR, Estimated: >60 mL/min (ref >60)
Glucose, Bld: 96 mg/dL (ref 70–99)
Potassium: 3.2 mmol/L — ABNORMAL LOW (ref 3.5–5.1)
Sodium: 138 mmol/L (ref 135–145)

## 2020-09-12 LAB — FERRITIN: Ferritin: 709 ng/mL — ABNORMAL HIGH (ref 11–307)

## 2020-09-12 LAB — PROTIME-INR
INR: 1 (ref 0.8–1.2)
Prothrombin Time: 13.1 seconds (ref 11.4–15.2)

## 2020-09-12 LAB — CORTISOL-AM, BLOOD: Cortisol - AM: 13.2 ug/dL (ref 6.7–22.6)

## 2020-09-12 LAB — VITAMIN B12: Vitamin B-12: 161 pg/mL — ABNORMAL LOW (ref 180–914)

## 2020-09-12 LAB — FOLATE: Folate: 3.3 ng/mL — ABNORMAL LOW (ref >5.9)

## 2020-09-12 LAB — PROCALCITONIN: Procalcitonin: 0.28 ng/mL

## 2020-09-12 LAB — MAGNESIUM: Magnesium: 1.5 mg/dL — ABNORMAL LOW (ref 1.7–2.4)

## 2020-09-12 LAB — PHOSPHORUS: Phosphorus: 4.9 mg/dL — ABNORMAL HIGH (ref 2.5–4.6)

## 2020-09-12 MED ORDER — PANTOPRAZOLE SODIUM 40 MG PO TBEC
40.0000 mg | DELAYED_RELEASE_TABLET | Freq: Two times a day (BID) | ORAL | Status: DC
  Administered 2020-09-12 – 2020-09-13 (×2): 40 mg via ORAL
  Filled 2020-09-12 (×3): qty 1

## 2020-09-12 MED ORDER — CALCIUM CARBONATE ANTACID 500 MG PO CHEW
1.0000 | CHEWABLE_TABLET | Freq: Once | ORAL | Status: AC
  Administered 2020-09-12: 200 mg via ORAL

## 2020-09-12 MED ORDER — SENNOSIDES-DOCUSATE SODIUM 8.6-50 MG PO TABS: 1.0000 | ORAL_TABLET | Freq: Every evening | ORAL | Status: DC | PRN

## 2020-09-12 MED ORDER — TRAZODONE HCL 50 MG PO TABS
50.0000 mg | ORAL_TABLET | Freq: Every evening | ORAL | Status: DC | PRN
  Administered 2020-09-12: 22:00:00 50 mg via ORAL
  Filled 2020-09-12: qty 1

## 2020-09-12 MED ORDER — VITAMIN B-12 1000 MCG PO TABS
1000.0000 ug | ORAL_TABLET | Freq: Every day | ORAL | Status: DC
  Administered 2020-09-12: 09:00:00 1000 ug via ORAL
  Filled 2020-09-12: qty 1

## 2020-09-12 MED ORDER — CYANOCOBALAMIN 1000 MCG/ML IJ SOLN
1000.0000 ug | INTRAMUSCULAR | Status: DC
  Administered 2020-09-12: 14:00:00 1000 ug via INTRAMUSCULAR
  Filled 2020-09-12: qty 1

## 2020-09-12 MED ORDER — LORATADINE 10 MG PO TABS: 10.0000 mg | ORAL_TABLET | Freq: Every day | ORAL | Status: DC | PRN

## 2020-09-12 MED ORDER — POTASSIUM CHLORIDE CRYS ER 20 MEQ PO TBCR
40.0000 meq | EXTENDED_RELEASE_TABLET | ORAL | Status: AC
  Administered 2020-09-12 (×2): 40 meq via ORAL
  Filled 2020-09-12 (×2): qty 2

## 2020-09-12 MED ORDER — LAMOTRIGINE 25 MG PO TABS
225.0000 mg | ORAL_TABLET | Freq: Every day | ORAL | Status: DC
  Administered 2020-09-12 – 2020-09-13 (×2): 225 mg via ORAL
  Filled 2020-09-12 (×2): qty 1

## 2020-09-12 MED ORDER — VITAMIN D 25 MCG (1000 UNIT) PO TABS
1000.0000 [IU] | ORAL_TABLET | Freq: Every day | ORAL | Status: DC
  Administered 2020-09-13: 1000 [IU] via ORAL
  Filled 2020-09-12 (×2): qty 1

## 2020-09-12 NOTE — Evaluation (Addendum)
Physical Therapy Evaluation Patient Details Name: Hailey Castaneda MRN: [id number] DOB: December 08, 1985 Today's Date: 09/12/2020   History of Present Illness  Pt admitted for sepsis secondary to UTI with complaints of increased pain. History includes bipolar disorder, alcohol abuse, and chronic neuropathy.  Clinical Impression  Pt is a pleasant 35 year old female who was admitted for sepsis with UTI. Pt demonstrates all bed mobility/transfers/ambulation at baseline level. Pt reports numbness has improved. Pt does have chronic neuropathy. Strength symmetrical on B LE. Educated on safe mobility techniques. Pt does not require any further PT needs at this time. Pt will be dc in house and does not require follow up. RN aware. Will dc current orders.     Follow Up Recommendations No PT follow up    Equipment Recommendations  None recommended by PT    Recommendations for Other Services       Precautions / Restrictions Precautions Precautions: Fall Restrictions Weight Bearing Restrictions: No      Mobility  Bed Mobility Overal bed mobility: Independent             General bed mobility comments: safe technique with ease of mobility    Transfers Overall transfer level: Independent               General transfer comment: safe technique  Ambulation/Gait Ambulation/Gait assistance: Supervision Gait Distance (Feet): 80 Feet Assistive device: None Gait Pattern/deviations: Step-through pattern;Shuffle     General Gait Details: ambulated in room without AD. 1 bout of unsteadiness during turns, however pt able to self correct. Education provided on safe Insurance risk surveyor    Modified Rankin (Stroke Patients Only)       Balance Overall balance assessment: Mild deficits observed, not formally tested                                           Pertinent Vitals/Pain Pain Assessment: No/denies pain    Home Living  Family/patient expects to be discharged to:: Private residence Living Arrangements: Alone Available Help at Discharge: Family Type of Home: House Home Access: Stairs to enter Entrance Stairs-Rails: None Entrance Stairs-Number of Steps: 1 small threshold step Home Layout: One level Home Equipment: Crutches      Prior Function Level of Independence: Independent         Comments: reports no falls, indep prior. Not currently working     Hand Dominance        Extremity/Trunk Assessment   Upper Extremity Assessment Upper Extremity Assessment: Overall WFL for tasks assessed (B hand numbness)    Lower Extremity Assessment Lower Extremity Assessment: Overall WFL for tasks assessed (B foot numbness)       Communication   Communication: No difficulties  Cognition Arousal/Alertness: Awake/alert Behavior During Therapy: WFL for tasks assessed/performed Overall Cognitive Status: Within Functional Limits for tasks assessed                                        General Comments      Exercises Other Exercises Other Exercises: education given on safe mobility efforts including correct shoes, tripping hazzards, correct lighting, and attention to task   Assessment/Plan    PT Assessment Patent does not need any further PT  services  PT Problem List         PT Treatment Interventions      PT Goals (Current goals can be found in the Care Plan section)  Acute Rehab PT Goals Patient Stated Goal: to go home PT Goal Formulation: All assessment and education complete, DC therapy Time For Goal Achievement: 09/12/20 Potential to Achieve Goals: Good    Frequency     Barriers to discharge        Co-evaluation               AM-PAC PT "6 Clicks" Mobility  Outcome Measure Help needed turning from your back to your side while in a flat bed without using bedrails?: None Help needed moving from lying on your back to sitting on the side of a flat bed  without using bedrails?: None Help needed moving to and from a bed to a chair (including a wheelchair)?: None Help needed standing up from a chair using your arms (e.g., wheelchair or bedside chair)?: None Help needed to walk in hospital room?: None Help needed climbing 3-5 steps with a railing? : None 6 Click Score: 24    End of Session   Activity Tolerance: Patient tolerated treatment well Patient left: in bed;with family/visitor present Nurse Communication: Mobility status PT Visit Diagnosis: Difficulty in walking, not elsewhere classified (R26.2)    Time: 0600-4599 PT Time Calculation (min) (ACUTE ONLY): 16 min   Charges:   PT Evaluation $PT Eval Low Complexity: 1 Low PT Treatments $Gait Training: 8-22 mins        Elizabeth Palau, PT, DPT [phone]   [name] 09/12/2020, 3:08 PM

## 2020-09-12 NOTE — Progress Notes (Signed)
OT Cancellation Note  Patient Details Name: Hailey Castaneda MRN: [id number] DOB: 1985/06/14   Cancelled Treatment:    Reason Eval/Treat Not Completed: Patient declined, no reason specified. Consult received, chart reviewed. Spoke with PT who worked with pt. Per PT, pt is familiar with OT and declines need for OT at this time. Will sign off per pt request. Please re-consult if additional acute needs arise.   Wynona Canes, MPH, MS, OTR/L ascom [phone] 09/12/20, 3:02 PM

## 2020-09-12 NOTE — Progress Notes (Signed)
PROGRESS NOTE    Hailey Castaneda  PJA:[id number] DOB: August 26, 1985 DOA: 09/11/2020 PCP: Larae Grooms, NP   Brief Narrative:  35 year old with history of bipolar disorder, alcohol abuse, neuropathy comes to the hospital with complaints of flank pain.  Recently diagnosed with UTI but worsening symptoms as outpatient therefore admitted to the hospital.  Initially found to be tachycardic, clinically dehydrated.  CT abdomen pelvis was negative for any pyelonephritis but showed hepatic steatosis.  Empirically started on IV Rocephin.   Assessment & Plan:   Principal Problem:   Sepsis secondary to UTI [facility]) Active Problems:   Bipolar 1 disorder (HCC)   Alcohol use disorder, moderate, dependence (HCC)   Hepatic steatosis   Hypokalemia   Macrocytic anemia      Sepsis secondary to UTI (HCC) -Sepsis physiology is improved.  Follow culture data.  On empiric IV Rocephin.     Hypokalemia -Replete as needed     Alcohol use disorder, moderate, dependence (HCC) Transaminitis, EtOH use pattern - CIWA withdrawal protocol -Counseled to quit using.  Folic acid, thiamine and multivitamin     Hepatic steatosis/elevated LFTs -Likely alcohol related.  Upper quadrant ultrasound is negative for cholelithiasis or cholecystitis.  Continue to monitor   Macrocytic anemia Folate and vitamin B12 deficiency --Related to alcohol use, B12 and folate deficiency.  Folic acid and vitamin B12 supplements  Peripheral neuropathy - Secondary to vitamin B12 and folate deficiency.  PT/OT ordered.  Getting supplements     Bipolar 1 disorder (HCC) -  daily Lamictal   PT/OT  DVT prophylaxis: enoxaparin (LOVENOX) injection 40 mg Start: 09/11/20 2200  Code Status: Full Family Communication: None    Dispo: The patient is from: Home              Anticipated d/c is to: Home              Patient currently is not medically stable to d/c.  Patient is still having lower abdominal discomfort and tells me she feels  numb everywhere in her lower extremity on her mouth her hands.  Her legs feel heavy and is having difficulty ambulating.   Difficult to place patient No       Nutritional status           Body mass index is 20.63 kg/m.           Subjective: Patient tells me she still having lower abdominal discomfort and feels numb throughout her whole body and her face.  Feels like her strength is okay but her legs feel like Jell-O when she tries to get up and move around.  She is afraid of falling.  She drinks about half a liter of vodka every day for at least past 1 and half years.  Review of Systems Otherwise negative except as per HPI, including: General: Denies fever, chills, night sweats or unintended weight loss. Resp: Denies cough, wheezing, shortness of breath. Cardiac: Denies chest pain, palpitations, orthopnea, paroxysmal nocturnal dyspnea. GI: Denies abdominal pain, nausea, vomiting, diarrhea or constipation GU: Denies dysuria, frequency, hesitancy or incontinence MS: Denies muscle aches, joint pain or swelling Neuro: Denies headache, neurologic deficits (focal weakness, numbness, tingling), abnormal gait Psych: Denies anxiety, depression, SI/HI/AVH Skin: Denies new rashes or lesions ID: Denies sick contacts, exotic exposures, travel  Examination:  General exam: Appears calm and comfortable  Respiratory system: Clear to auscultation. Respiratory effort normal. Cardiovascular system: S1 & S2 heard, RRR. No JVD, murmurs, rubs, gallops or clicks. No pedal edema. Gastrointestinal  system: Abdomen is nondistended, soft and nontender. No organomegaly or masses felt. Normal bowel sounds heard. Central nervous system: Alert and oriented. No focal neurological deficits. Extremities: Symmetric 5 x 5 power. Skin: No rashes, lesions or ulcers Psychiatry: Judgement and insight appear normal. Mood & affect appropriate.     Objective: Vitals:   09/12/20 0500 09/12/20 0530  09/12/20 0600 09/12/20 0630  BP: (!) 153/115 (!) 151/108 (!) 161/119 (!) 160/118  Pulse: (!) 101 91 (!) 111 94  Resp: 18 16 19 16   Temp:   98.6 F (37 C)   TempSrc:      SpO2: 94% 97% 99% 98%  Weight:      Height:        Intake/Output Summary (Last 24 hours) at 09/12/2020 0742 Last data filed at 09/12/2020 0600 Gross per 24 hour  Intake 1200 ml  Output 850 ml  Net 350 ml   Filed Weights   09/11/20 1431  Weight: 56.2 kg     Data Reviewed:   CBC: Recent Labs  Lab 09/11/20 1442  WBC 6.6  NEUTROABS 4.1  HGB 11.8*  HCT 32.9*  MCV 103.8*  PLT 387   Basic Metabolic Panel: Recent Labs  Lab 09/11/20 1442 09/12/20 0625  NA 137 138  K 3.0* 3.2*  CL 96* 101  CO2 26 27  GLUCOSE 119* 96  BUN <5* <5*  CREATININE 0.63 0.55  CALCIUM 9.1 8.1*  MG 1.5*  --   PHOS 4.9*  --    GFR: Estimated Creatinine Clearance: 87.1 mL/min (by C-G formula based on SCr of 0.55 mg/dL). Liver Function Tests: Recent Labs  Lab 09/11/20 1442  AST 186*  ALT 54*  ALKPHOS 195*  BILITOT 1.7*  PROT 7.0  ALBUMIN 3.2*   No results for input(s): LIPASE, AMYLASE in the last 168 hours. No results for input(s): AMMONIA in the last 168 hours. Coagulation Profile: Recent Labs  Lab 09/11/20 1552 09/12/20 0625  INR 1.0 1.0   Cardiac Enzymes: No results for input(s): CKTOTAL, CKMB, CKMBINDEX, TROPONINI in the last 168 hours. BNP (last 3 results) No results for input(s): PROBNP in the last 8760 hours. HbA1C: No results for input(s): HGBA1C in the last 72 hours. CBG: No results for input(s): GLUCAP in the last 168 hours. Lipid Profile: No results for input(s): CHOL, HDL, LDLCALC, TRIG, CHOLHDL, LDLDIRECT in the last 72 hours. Thyroid Function Tests: No results for input(s): TSH, T4TOTAL, FREET4, T3FREE, THYROIDAB in the last 72 hours. Anemia Panel: Recent Labs    09/12/20 0045  FOLATE 3.3*  FERRITIN 709*  TIBC 207*  IRON 185*   Sepsis Labs: Recent Labs  Lab 09/11/20 1442  09/11/20 1849  LATICACIDVEN 4.3* 2.7*    Recent Results (from the past 240 hour(s))  Blood culture (routine single)     Status: None (Preliminary result)   Collection Time: 09/11/20  2:42 PM   Specimen: BLOOD  Result Value Ref Range Status   Specimen Description BLOOD LEFT ANTECUBITAL  Final   Special Requests   Final    BOTTLES DRAWN AEROBIC AND ANAEROBIC Blood Culture results may not be optimal due to an excessive volume of blood received in culture bottles   Culture   Final    NO GROWTH < 24 HOURS Performed at [facility], [address] [place], [place] [address]    Report Status PENDING  Incomplete  Resp Panel by RT-PCR (Flu A&B, Covid) Nasopharyngeal Swab     Status: None   Collection Time:  09/11/20  3:52 PM   Specimen: Nasopharyngeal Swab; Nasopharyngeal(NP) swabs in vial transport medium  Result Value Ref Range Status   SARS Coronavirus 2 by RT PCR NEGATIVE NEGATIVE Final    Comment: (NOTE) SARS-CoV-2 target nucleic acids are NOT DETECTED.  The SARS-CoV-2 RNA is generally detectable in upper respiratory specimens during the acute phase of infection. The lowest concentration of SARS-CoV-2 viral copies this assay can detect is 138 copies/mL. A negative result does not preclude SARS-Cov-2 infection and should not be used as the sole basis for treatment or other patient management decisions. A negative result may occur with  improper specimen collection/handling, submission of specimen other than nasopharyngeal swab, presence of viral mutation(s) within the areas targeted by this assay, and inadequate number of viral copies(<138 copies/mL). A negative result must be combined with clinical observations, patient history, and epidemiological information. The expected result is Negative.  Fact Sheet for Patients:  BloggerCourse.com  Fact Sheet for Healthcare Providers:  SeriousBroker.it  This test is no t yet  approved or cleared by the [place] FDA and  has been authorized for detection and/or diagnosis of SARS-CoV-2 by FDA under an Emergency Use Authorization (EUA). This EUA will remain  in effect (meaning this test can be used) for the duration of the COVID-19 declaration under Section 564(b)(1) of the Act, 21 U.S.C.section 360bbb-3(b)(1), unless the authorization is terminated  or revoked sooner.       Influenza A by PCR NEGATIVE NEGATIVE Final   Influenza B by PCR NEGATIVE NEGATIVE Final    Comment: (NOTE) The Xpert Xpress SARS-CoV-2/FLU/RSV plus assay is intended as an aid in the diagnosis of influenza from Nasopharyngeal swab specimens and should not be used as a sole basis for treatment. Nasal washings and aspirates are unacceptable for Xpert Xpress SARS-CoV-2/FLU/RSV testing.  Fact Sheet for Patients: BloggerCourse.com  Fact Sheet for Healthcare Providers: SeriousBroker.it  This test is not yet approved or cleared by the [place] FDA and has been authorized for detection and/or diagnosis of SARS-CoV-2 by FDA under an Emergency Use Authorization (EUA). This EUA will remain in effect (meaning this test can be used) for the duration of the COVID-19 declaration under Section 564(b)(1) of the Act, 21 U.S.C. section 360bbb-3(b)(1), unless the authorization is terminated or revoked.  Performed at [facility], [address] [place], [place] [address]          Radiology Studies: CT ABDOMEN PELVIS WO CONTRAST  Result Date: 09/11/2020 CLINICAL DATA:  Abdominal pain EXAM: CT ABDOMEN AND PELVIS WITHOUT CONTRAST TECHNIQUE: Multidetector CT imaging of the abdomen and pelvis was performed following the standard protocol without IV contrast. COMPARISON:  None. FINDINGS: Lower chest: Lung bases are clear. No effusions. Heart is normal size. Hepatobiliary: Diffuse low-density throughout the liver compatible with  fatty infiltration. No focal abnormality. Gallbladder unremarkable. Pancreas: No focal abnormality or ductal dilatation. Spleen: No focal abnormality.  Normal size. Adrenals/Urinary Tract: No adrenal abnormality. No focal renal abnormality. No stones or hydronephrosis. Urinary bladder is unremarkable. Stomach/Bowel: Stomach, large and small bowel grossly unremarkable. Normal appendix. Vascular/Lymphatic: No evidence of aneurysm or adenopathy. Reproductive: Uterus and adnexa unremarkable.  No mass. Other: No free fluid or free air. Musculoskeletal: No acute bony abnormality. IMPRESSION: Hepatic steatosis. No acute findings in the abdomen or pelvis. Electronically Signed   By: Charlett Nose M.D.   On: 09/11/2020 18:04   US Abdomen Limited RUQ (LIVER/GB)  Result Date: 09/11/2020 CLINICAL DATA:  Right upper quadrant pain for 5 days  EXAM: ULTRASOUND ABDOMEN LIMITED RIGHT UPPER QUADRANT COMPARISON:  CT 09/11/2020 FINDINGS: Gallbladder: No gallstones or wall thickening visualized. No sonographic Murphy sign noted by sonographer. Common bile duct: Diameter: 2 mm, normal Liver: Diffusely increased hepatic parenchymal echotexture consistent with diffuse fatty infiltration. No focal lesions identified. Portal vein is patent on color Doppler imaging with normal direction of blood flow towards the liver. Other: None. IMPRESSION: Diffuse fatty infiltration of the liver. No evidence of cholelithiasis or cholecystitis. Electronically Signed   By: Burman Nieves M.D.   On: 09/11/2020 18:19        Scheduled Meds:  enoxaparin (LOVENOX) injection  40 mg Subcutaneous Q24H   folic acid  1 mg Oral Daily   LORazepam  0-4 mg Oral Q6H   Followed by   Melene Muller ON 09/13/2020] LORazepam  0-4 mg Oral Q12H   multivitamin with minerals  1 tablet Oral Daily   thiamine  100 mg Oral Daily   Or   thiamine  100 mg Intravenous Daily   Continuous Infusions:  sodium chloride 125 mL/hr at 09/12/20 0500   cefTRIAXone (ROCEPHIN)  IV  Stopped (09/11/20 1833)   cefTRIAXone (ROCEPHIN)  IV       LOS: 0 days   Time spent= 35 mins    Ainsley Sanguinetti Joline Maxcy, MD [facility]  If 7PM-7AM, please contact night-coverage  09/12/2020, 7:42 AM

## 2020-09-13 LAB — COMPREHENSIVE METABOLIC PANEL
ALT: 34 U/L (ref 0–44)
AST: 97 U/L — ABNORMAL HIGH (ref 15–41)
Albumin: 2.3 g/dL — ABNORMAL LOW (ref 3.5–5.0)
Alkaline Phosphatase: 146 U/L — ABNORMAL HIGH (ref 38–126)
Anion gap: 5 (ref 5–15)
BUN: <5 mg/dL — ABNORMAL LOW (ref 6–20)
CO2: 27 mmol/L (ref 22–32)
Calcium: 8.6 mg/dL — ABNORMAL LOW (ref 8.9–10.3)
Chloride: 105 mmol/L (ref 98–111)
Creatinine, Ser: 0.44 mg/dL (ref 0.44–1.00)
GFR, Estimated: >60 mL/min (ref >60)
Glucose, Bld: 77 mg/dL (ref 70–99)
Potassium: 4.4 mmol/L (ref 3.5–5.1)
Sodium: 137 mmol/L (ref 135–145)
Total Bilirubin: 1.5 mg/dL — ABNORMAL HIGH (ref 0.3–1.2)
Total Protein: 5.4 g/dL — ABNORMAL LOW (ref 6.5–8.1)

## 2020-09-13 LAB — URINE CULTURE: Culture: 40000 — AB

## 2020-09-13 LAB — CBC
HCT: 27.3 % — ABNORMAL LOW (ref 36.0–46.0)
Hemoglobin: 9.5 g/dL — ABNORMAL LOW (ref 12.0–15.0)
MCH: 36.8 pg — ABNORMAL HIGH (ref 26.0–34.0)
MCHC: 34.8 g/dL (ref 30.0–36.0)
MCV: 105.8 fL — ABNORMAL HIGH (ref 80.0–100.0)
Platelets: 282 10*3/uL (ref 150–400)
RBC: 2.58 MIL/uL — ABNORMAL LOW (ref 3.87–5.11)
RDW: 20.8 % — ABNORMAL HIGH (ref 11.5–15.5)
WBC: 5.1 10*3/uL (ref 4.0–10.5)
nRBC: 0 % (ref 0.0–0.2)

## 2020-09-13 LAB — MAGNESIUM: Magnesium: 1.5 mg/dL — ABNORMAL LOW (ref 1.7–2.4)

## 2020-09-13 MED ORDER — CEFDINIR 300 MG PO CAPS
300.0000 mg | ORAL_CAPSULE | Freq: Two times a day (BID) | ORAL | Status: DC
  Filled 2020-09-13 (×2): qty 1

## 2020-09-13 MED ORDER — FOLIC ACID 1 MG PO TABS: 1.0000 mg | ORAL_TABLET | Freq: Every day | ORAL | 0 refills | Status: DC

## 2020-09-13 MED ORDER — THIAMINE HCL 100 MG PO TABS: 100.0000 mg | ORAL_TABLET | Freq: Every day | ORAL | 0 refills | Status: DC

## 2020-09-13 MED ORDER — ADULT MULTIVITAMIN W/MINERALS CH: 1.0000 | ORAL_TABLET | Freq: Every day | ORAL | 0 refills | Status: DC

## 2020-09-13 MED ORDER — CEFDINIR 300 MG PO CAPS: 300.0000 mg | ORAL_CAPSULE | Freq: Two times a day (BID) | ORAL | 0 refills | Status: AC

## 2020-09-13 NOTE — Progress Notes (Deleted)
PROGRESS NOTE    Hailey Castaneda  SEG:[id number] DOB: 1985/08/31 DOA: 09/11/2020 PCP: Larae Grooms, NP   Brief Narrative:  35 year old with history of bipolar disorder, alcohol abuse, neuropathy comes to the hospital with complaints of flank pain.  Recently diagnosed with UTI but worsening symptoms as outpatient therefore admitted to the hospital.  Initially found to be tachycardic, clinically dehydrated.  CT abdomen pelvis was negative for any pyelonephritis but showed hepatic steatosis.  Empirically started on IV Rocephin.   Assessment & Plan:   Principal Problem:   Sepsis secondary to UTI [facility]) Active Problems:   Bipolar 1 disorder (HCC)   Alcohol use disorder, moderate, dependence (HCC)   Hepatic steatosis   Hypokalemia   Macrocytic anemia    Sepsis secondary to UTI (HCC) -Sepsis physiology is improved.  Follow culture data.  On empiric IV Rocephin -de-escalate to p.o. once sensitivities obtained   Hypokalemia -Replete as needed   Alcohol use disorder, moderate, dependence (HCC) Transaminitis, EtOH use pattern - CIWA withdrawal protocol -Counseled to quit using.  Folic acid, thiamine and multivitamin     Hepatic steatosis/elevated LFTs -Likely alcohol related.  Upper quadrant ultrasound is negative for cholelithiasis or cholecystitis.  Continue to monitor   Macrocytic anemia Folate and vitamin B12 deficiency --Related to alcohol use, B12 and folate deficiency.  Folic acid and vitamin B12 supplements  Peripheral neuropathy - Secondary to vitamin B12 and folate deficiency.  PT/OT ordered.  Getting supplements     Bipolar 1 disorder (HCC) -  daily Lamictal   PT/OT  DVT prophylaxis: enoxaparin (LOVENOX) injection 40 mg Start: 09/11/20 2200  Code Status: Full Family Communication: None    Dispo: The patient is from: Home              Anticipated d/c is to: Home              Patient currently is not medically stable to d/c.  Patient is still having lower  abdominal discomfort and tells me she feels numb everywhere in her lower extremity on her mouth her hands.  Her legs feel heavy and is having difficulty ambulating.   Difficult to place patient No    Subjective: No acute issues or events overnight, reports generalized abdominal and leg pain without any provocation or alleviating factors.  Otherwise denies shortness of breath nausea vomiting diarrhea constipation headache fevers chills or chest pain  Examination: General exam: Appears calm and comfortable  Respiratory system: Clear to auscultation. Respiratory effort normal. Cardiovascular system: S1 & S2 heard, RRR. No JVD, murmurs, rubs, gallops or clicks. No pedal edema. Gastrointestinal system: Abdomen is nondistended, soft and nontender. No organomegaly or masses felt. Normal bowel sounds heard. Central nervous system: Alert and oriented. No focal neurological deficits. Extremities: Symmetric 5 x 5 power. Skin: No rashes, lesions or ulcers Psychiatry: Judgement and insight appear normal. Mood & affect appropriate.   Objective: Vitals:   09/12/20 1637 09/12/20 1938 09/13/20 0119 09/13/20 0446  BP: (!) 151/117 (!) 143/110 (!) 123/100 (!) 144/109  Pulse: (!) 108 (!) 104 (!) 103 (!) 106  Resp: 16 18 18 18   Temp: 98.5 F (36.9 C) 98.8 F (37.1 C) 98.6 F (37 C) 98.6 F (37 C)  TempSrc:      SpO2: 98% 97% 98% 99%  Weight:      Height:        Intake/Output Summary (Last 24 hours) at 09/13/2020 0726 Last data filed at 09/13/2020 0159 Gross per 24 hour  Intake 3199.28  ml  Output --  Net 3199.28 ml    Filed Weights   09/11/20 1431  Weight: 56.2 kg     Data Reviewed:   CBC: Recent Labs  Lab 09/11/20 1442 09/12/20 0625 09/13/20 0509  WBC 6.6 4.5 5.1  NEUTROABS 4.1  --   --   HGB 11.8* 9.7* 9.5*  HCT 32.9* 28.3* 27.3*  MCV 103.8* 104.8* 105.8*  PLT 387 270 282    Basic Metabolic Panel: Recent Labs  Lab 09/11/20 1442 09/12/20 0625 09/13/20 0509  NA 137 138  137  K 3.0* 3.2* 4.4  CL 96* 101 105  CO2 26 27 27   GLUCOSE 119* 96 77  BUN <5* <5* <5*  CREATININE 0.63 0.55 0.44  CALCIUM 9.1 8.1* 8.6*  MG 1.5*  --  1.5*  PHOS 4.9*  --   --     GFR: Estimated Creatinine Clearance: 87.1 mL/min (by C-G formula based on SCr of 0.44 mg/dL). Liver Function Tests: Recent Labs  Lab 09/11/20 1442 09/13/20 0509  AST 186* 97*  ALT 54* 34  ALKPHOS 195* 146*  BILITOT 1.7* 1.5*  PROT 7.0 5.4*  ALBUMIN 3.2* 2.3*    No results for input(s): LIPASE, AMYLASE in the last 168 hours. No results for input(s): AMMONIA in the last 168 hours. Coagulation Profile: Recent Labs  Lab 09/11/20 1552 09/12/20 0625  INR 1.0 1.0    Cardiac Enzymes: No results for input(s): CKTOTAL, CKMB, CKMBINDEX, TROPONINI in the last 168 hours. BNP (last 3 results) No results for input(s): PROBNP in the last 8760 hours. HbA1C: No results for input(s): HGBA1C in the last 72 hours. CBG: No results for input(s): GLUCAP in the last 168 hours. Lipid Profile: No results for input(s): CHOL, HDL, LDLCALC, TRIG, CHOLHDL, LDLDIRECT in the last 72 hours. Thyroid Function Tests: No results for input(s): TSH, T4TOTAL, FREET4, T3FREE, THYROIDAB in the last 72 hours. Anemia Panel: Recent Labs    09/12/20 0045  VITAMINB12 161*  FOLATE 3.3*  FERRITIN 709*  TIBC 207*  IRON 185*    Sepsis Labs: Recent Labs  Lab 09/11/20 1442 09/11/20 1849 09/12/20 0625  PROCALCITON  --   --  0.28  LATICACIDVEN 4.3* 2.7*  --      Recent Results (from the past 240 hour(s))  Blood culture (routine single)     Status: None (Preliminary result)   Collection Time: 09/11/20  2:42 PM   Specimen: BLOOD  Result Value Ref Range Status   Specimen Description BLOOD LEFT ANTECUBITAL  Final   Special Requests   Final    BOTTLES DRAWN AEROBIC AND ANAEROBIC Blood Culture results may not be optimal due to an excessive volume of blood received in culture bottles   Culture   Final    NO GROWTH 2  DAYS Performed at [facility], [address] [place], [place]    Report Status PENDING  Incomplete  Resp Panel by RT-PCR (Flu A&B, Covid) Nasopharyngeal Swab     Status: None   Collection Time: 09/11/20  3:52 PM   Specimen: Nasopharyngeal Swab; Nasopharyngeal(NP) swabs in vial transport medium  Result Value Ref Range Status   SARS Coronavirus 2 by RT PCR NEGATIVE NEGATIVE Final    Comment: (NOTE) SARS-CoV-2 target nucleic acids are NOT DETECTED.  The SARS-CoV-2 RNA is generally detectable in upper respiratory specimens during the acute phase of infection. The lowest concentration of SARS-CoV-2 viral copies this assay can detect is 138 copies/mL. A negative result does not preclude  SARS-Cov-2 infection and should not be used as the sole basis for treatment or other patient management decisions. A negative result may occur with  improper specimen collection/handling, submission of specimen other than nasopharyngeal swab, presence of viral mutation(s) within the areas targeted by this assay, and inadequate number of viral copies(<138 copies/mL). A negative result must be combined with clinical observations, patient history, and epidemiological information. The expected result is Negative.  Fact Sheet for Patients:  BloggerCourse.com  Fact Sheet for Healthcare Providers:  SeriousBroker.it  This test is no t yet approved or cleared by the [place] FDA and  has been authorized for detection and/or diagnosis of SARS-CoV-2 by FDA under an Emergency Use Authorization (EUA). This EUA will remain  in effect (meaning this test can be used) for the duration of the COVID-19 declaration under Section 564(b)(1) of the Act, 21 U.S.C.section 360bbb-3(b)(1), unless the authorization is terminated  or revoked sooner.       Influenza A by PCR NEGATIVE NEGATIVE Final   Influenza B by PCR NEGATIVE NEGATIVE Final     Comment: (NOTE) The Xpert Xpress SARS-CoV-2/FLU/RSV plus assay is intended as an aid in the diagnosis of influenza from Nasopharyngeal swab specimens and should not be used as a sole basis for treatment. Nasal washings and aspirates are unacceptable for Xpert Xpress SARS-CoV-2/FLU/RSV testing.  Fact Sheet for Patients: BloggerCourse.com  Fact Sheet for Healthcare Providers: SeriousBroker.it  This test is not yet approved or cleared by the [place] FDA and has been authorized for detection and/or diagnosis of SARS-CoV-2 by FDA under an Emergency Use Authorization (EUA). This EUA will remain in effect (meaning this test can be used) for the duration of the COVID-19 declaration under Section 564(b)(1) of the Act, 21 U.S.C. section 360bbb-3(b)(1), unless the authorization is terminated or revoked.  Performed at [facility] [address] [place], [place] [address]           Radiology Studies: CT ABDOMEN PELVIS WO CONTRAST  Result Date: 09/11/2020 CLINICAL DATA:  Abdominal pain EXAM: CT ABDOMEN AND PELVIS WITHOUT CONTRAST TECHNIQUE: Multidetector CT imaging of the abdomen and pelvis was performed following the standard protocol without IV contrast. COMPARISON:  None. FINDINGS: Lower chest: Lung bases are clear. No effusions. Heart is normal size. Hepatobiliary: Diffuse low-density throughout the liver compatible with fatty infiltration. No focal abnormality. Gallbladder unremarkable. Pancreas: No focal abnormality or ductal dilatation. Spleen: No focal abnormality.  Normal size. Adrenals/Urinary Tract: No adrenal abnormality. No focal renal abnormality. No stones or hydronephrosis. Urinary bladder is unremarkable. Stomach/Bowel: Stomach, large and small bowel grossly unremarkable. Normal appendix. Vascular/Lymphatic: No evidence of aneurysm or adenopathy. Reproductive: Uterus and adnexa unremarkable.  No mass. Other: No  free fluid or free air. Musculoskeletal: No acute bony abnormality. IMPRESSION: Hepatic steatosis. No acute findings in the abdomen or pelvis. Electronically Signed   By: Charlett Nose M.D.   On: 09/11/2020 18:04   US Abdomen Limited RUQ (LIVER/GB)  Result Date: 09/11/2020 CLINICAL DATA:  Right upper quadrant pain for 5 days EXAM: ULTRASOUND ABDOMEN LIMITED RIGHT UPPER QUADRANT COMPARISON:  CT 09/11/2020 FINDINGS: Gallbladder: No gallstones or wall thickening visualized. No sonographic Murphy sign noted by sonographer. Common bile duct: Diameter: 2 mm, normal Liver: Diffusely increased hepatic parenchymal echotexture consistent with diffuse fatty infiltration. No focal lesions identified. Portal vein is patent on color Doppler imaging with normal direction of blood flow towards the liver. Other: None. IMPRESSION: Diffuse fatty infiltration of the liver. No evidence of cholelithiasis or cholecystitis.  Electronically Signed   By: Burman NievesWilliam  Stevens M.D.   On: 09/11/2020 18:19        Scheduled Meds:  cholecalciferol  1,000 Units Oral Daily   cyanocobalamin  1,000 mcg Intramuscular Weekly   enoxaparin (LOVENOX) injection  40 mg Subcutaneous Q24H   folic acid  1 mg Oral Daily   lamoTRIgine  225 mg Oral Daily   LORazepam  0-4 mg Oral Q6H   Followed by   LORazepam  0-4 mg Oral Q12H   multivitamin with minerals  1 tablet Oral Daily   pantoprazole  40 mg Oral BID   thiamine  100 mg Oral Daily   Or   thiamine  100 mg Intravenous Daily   Continuous Infusions:  sodium chloride 125 mL/hr at 09/13/20 0034   cefTRIAXone (ROCEPHIN)  IV Stopped (09/12/20 1831)     LOS: 1 day   Time spent= 35 mins  Azucena FallenWilliam C Daiel Strohecker, DO [facility]  If 7PM-7AM, please contact night-coverage  09/13/2020, 7:26 AM

## 2020-09-13 NOTE — Discharge Summary (Signed)
Physician Discharge Summary  Hailey Castaneda:[id number] DOB: [birth year] DOA: 09/11/2020  PCP: Larae Grooms, NP  Admit date: 09/11/2020 Discharge date: 09/13/2020  Admitted From: Home Disposition: Home  Recommendations for Outpatient Follow-up:  Follow up with PCP in 1-2 weeks Please obtain BMP/CBC in one week  Home Health: None Equipment/Devices: None  Discharge Condition: Stable CODE STATUS: Full Diet recommendation: Low-salt low-fat diet  Brief/Interim Summary: 35 year old with history of bipolar disorder, alcohol abuse, neuropathy comes to the hospital with complaints of flank pain.  Recently diagnosed with UTI but worsening symptoms as outpatient therefore admitted to the hospital.  Initially found to be tachycardic, clinically dehydrated.  CT abdomen pelvis was negative for any pyelonephritis but showed hepatic steatosis.  Empirically started on IV Rocephin.     Assessment & Plan:   Sepsis secondary to UTI (HCC) -Sepsis physiology is improved.  Follow culture data.  On empiric IV Rocephin initially but able to de-escalate to p.o. cefdinir for completion of therapy   Hypokalemia -Follow repeat labs with PCP as scheduled  Alcohol use disorder, moderate, dependence (HCC) Transaminitis, EtOH use pattern - CIWA withdrawal protocol - Counseled to quit using.  Folic acid, thiamine and multivitamin prescribed     Hepatic steatosis/elevated LFTs -Likely alcohol related.  Upper quadrant ultrasound is negative for cholelithiasis or cholecystitis.  Continue to monitor with PCP   Macrocytic anemia Folate and vitamin B12 deficiency --Related to alcohol use, B12 and folate deficiency.  Folic acid and vitamin B12 supplements   Peripheral neuropathy - Secondary to vitamin B12 and folate deficiency.  PT/OT ordered.  Getting supplements     Bipolar 1 disorder (HCC) -  daily Lamictal    Discharge Diagnoses:  Principal Problem:   Sepsis secondary to UTI [facility]) Active  Problems:   Bipolar 1 disorder (HCC)   Alcohol use disorder, moderate, dependence (HCC)   Hepatic steatosis   Hypokalemia   Macrocytic anemia    Discharge Instructions  Discharge Instructions     Call MD for:  extreme fatigue   Complete by: As directed    Call MD for:  persistant dizziness or light-headedness   Complete by: As directed    Call MD for:  severe uncontrolled pain   Complete by: As directed    Increase activity slowly   Complete by: As directed       Allergies as of 09/13/2020       Reactions   Azithromycin Other (See Comments)   Penicillins Other (See Comments)        Medication List     STOP taking these medications    vitamin B-12 1000 MCG tablet Commonly known as: CYANOCOBALAMIN       TAKE these medications    cefdinir 300 MG capsule Commonly known as: OMNICEF Take 1 capsule (300 mg total) by mouth every 12 (twelve) hours for 4 days.   cetirizine 10 MG tablet Commonly known as: ZYRTEC Take 10 mg by mouth daily as needed for allergies.   cholecalciferol 25 MCG (1000 UNIT) tablet Commonly known as: VITAMIN D3 Take 1,000 Units by mouth daily.   folic acid 1 MG tablet Commonly known as: FOLVITE Take 1 tablet (1 mg total) by mouth daily. Start taking on: September 14, 2020   Ixekizumab 80 MG/ML Sosy Inject 80 mg into the skin every 30 (thirty) days.   lamoTRIgine 150 MG tablet Commonly known as: LAMICTAL Take 225 mg by mouth daily.   multivitamin with minerals Tabs tablet Take 1 tablet by mouth  daily. Start taking on: September 14, 2020   thiamine 100 MG tablet Take 1 tablet (100 mg total) by mouth daily. Start taking on: September 14, 2020        Allergies  Allergen Reactions   Azithromycin Other (See Comments)   Penicillins Other (See Comments)    Consultations: None Procedures/Studies: CT ABDOMEN PELVIS WO CONTRAST  Result Date: 09/11/2020 CLINICAL DATA:  Abdominal pain EXAM: CT ABDOMEN AND PELVIS WITHOUT CONTRAST  TECHNIQUE: Multidetector CT imaging of the abdomen and pelvis was performed following the standard protocol without IV contrast. COMPARISON:  None. FINDINGS: Lower chest: Lung bases are clear. No effusions. Heart is normal size. Hepatobiliary: Diffuse low-density throughout the liver compatible with fatty infiltration. No focal abnormality. Gallbladder unremarkable. Pancreas: No focal abnormality or ductal dilatation. Spleen: No focal abnormality.  Normal size. Adrenals/Urinary Tract: No adrenal abnormality. No focal renal abnormality. No stones or hydronephrosis. Urinary bladder is unremarkable. Stomach/Bowel: Stomach, large and small bowel grossly unremarkable. Normal appendix. Vascular/Lymphatic: No evidence of aneurysm or adenopathy. Reproductive: Uterus and adnexa unremarkable.  No mass. Other: No free fluid or free air. Musculoskeletal: No acute bony abnormality. IMPRESSION: Hepatic steatosis. No acute findings in the abdomen or pelvis. Electronically Signed   By: Charlett Nose M.D.   On: 09/11/2020 18:04   US Abdomen Limited RUQ (LIVER/GB)  Result Date: 09/11/2020 CLINICAL DATA:  Right upper quadrant pain for 5 days EXAM: ULTRASOUND ABDOMEN LIMITED RIGHT UPPER QUADRANT COMPARISON:  CT 09/11/2020 FINDINGS: Gallbladder: No gallstones or wall thickening visualized. No sonographic Murphy sign noted by sonographer. Common bile duct: Diameter: 2 mm, normal Liver: Diffusely increased hepatic parenchymal echotexture consistent with diffuse fatty infiltration. No focal lesions identified. Portal vein is patent on color Doppler imaging with normal direction of blood flow towards the liver. Other: None. IMPRESSION: Diffuse fatty infiltration of the liver. No evidence of cholelithiasis or cholecystitis. Electronically Signed   By: Burman Nieves M.D.   On: 09/11/2020 18:19     Subjective: No acute issues or events overnight, patient's weakness paresthesias and pain appear to be resolving otherwise requesting  discharge home   Discharge Exam: Vitals:   09/13/20 0751 09/13/20 1125  BP: (!) 147/113 (!) 134/103  Pulse: (!) 105 (!) 104  Resp: 18 17  Temp: 97.9 F (36.6 C) 98.2 F (36.8 C)  SpO2: 100% 100%   Vitals:   09/13/20 0119 09/13/20 0446 09/13/20 0751 09/13/20 1125  BP: (!) 123/100 (!) 144/109 (!) 147/113 (!) 134/103  Pulse: (!) 103 (!) 106 (!) 105 (!) 104  Resp: 18 18 18 17   Temp: 98.6 F (37 C) 98.6 F (37 C) 97.9 F (36.6 C) 98.2 F (36.8 C)  TempSrc:      SpO2: 98% 99% 100% 100%  Weight:      Height:        General: Pt is alert, awake, not in acute distress Cardiovascular: RRR, S1/S2 +, no rubs, no gallops Respiratory: CTA bilaterally, no wheezing, no rhonchi Abdominal: Soft, NT, ND, bowel sounds + Extremities: no edema, no cyanosis    The results of significant diagnostics from this hospitalization (including imaging, microbiology, ancillary and laboratory) are listed below for reference.     Microbiology: Recent Results (from the past 240 hour(s))  Blood culture (routine single)     Status: None (Preliminary result)   Collection Time: 09/11/20  2:42 PM   Specimen: BLOOD  Result Value Ref Range Status   Specimen Description BLOOD LEFT ANTECUBITAL  Final   Special Requests  Final    BOTTLES DRAWN AEROBIC AND ANAEROBIC Blood Culture results may not be optimal due to an excessive volume of blood received in culture bottles   Culture   Final    NO GROWTH 2 DAYS Performed at [facility], [address], [place], [place] [address]    Report Status PENDING  Incomplete  Urine Culture     Status: Abnormal   Collection Time: 09/11/20  2:48 PM   Specimen: In/Out Cath Urine  Result Value Ref Range Status   Specimen Description   Final    IN/OUT CATH URINE Performed at [facility], [address], [place], [place] [address]    Special Requests   Final    NONE Performed at [facility], [address] [place], [place] [address]     Culture (A)  Final    40,000 COLONIES/mL DIPHTHEROIDS(CORYNEBACTERIUM SPECIES) Standardized susceptibility testing for this organism is not available. Performed at [facility], [address] [place], [place] [address]    Report Status 09/13/2020 FINAL  Final  Resp Panel by RT-PCR (Flu A&B, Covid) Nasopharyngeal Swab     Status: None   Collection Time: 09/11/20  3:52 PM   Specimen: Nasopharyngeal Swab; Nasopharyngeal(NP) swabs in vial transport medium  Result Value Ref Range Status   SARS Coronavirus 2 by RT PCR NEGATIVE NEGATIVE Final    Comment: (NOTE) SARS-CoV-2 target nucleic acids are NOT DETECTED.  The SARS-CoV-2 RNA is generally detectable in upper respiratory specimens during the acute phase of infection. The lowest concentration of SARS-CoV-2 viral copies this assay can detect is 138 copies/mL. A negative result does not preclude SARS-Cov-2 infection and should not be used as the sole basis for treatment or other patient management decisions. A negative result may occur with  improper specimen collection/handling, submission of specimen other than nasopharyngeal swab, presence of viral mutation(s) within the areas targeted by this assay, and inadequate number of viral copies(<138 copies/mL). A negative result must be combined with clinical observations, patient history, and epidemiological information. The expected result is Negative.  Fact Sheet for Patients:  BloggerCourse.com  Fact Sheet for Healthcare Providers:  SeriousBroker.it  This test is no t yet approved or cleared by the [place] FDA and  has been authorized for detection and/or diagnosis of SARS-CoV-2 by FDA under an Emergency Use Authorization (EUA). This EUA will remain  in effect (meaning this test can be used) for the duration of the COVID-19 declaration under Section 564(b)(1) of the Act, 21 U.S.C.section 360bbb-3(b)(1), unless the  authorization is terminated  or revoked sooner.       Influenza A by PCR NEGATIVE NEGATIVE Final   Influenza B by PCR NEGATIVE NEGATIVE Final    Comment: (NOTE) The Xpert Xpress SARS-CoV-2/FLU/RSV plus assay is intended as an aid in the diagnosis of influenza from Nasopharyngeal swab specimens and should not be used as a sole basis for treatment. Nasal washings and aspirates are unacceptable for Xpert Xpress SARS-CoV-2/FLU/RSV testing.  Fact Sheet for Patients: BloggerCourse.com  Fact Sheet for Healthcare Providers: SeriousBroker.it  This test is not yet approved or cleared by the [place] FDA and has been authorized for detection and/or diagnosis of SARS-CoV-2 by FDA under an Emergency Use Authorization (EUA). This EUA will remain in effect (meaning this test can be used) for the duration of the COVID-19 declaration under Section 564(b)(1) of the Act, 21 U.S.C. section 360bbb-3(b)(1), unless the authorization is terminated or revoked.  Performed at [facility], [address] [place], [place] [address]      Labs: BNP (last 3 results) No results for input(s): BNP in the last 8760 hours. Basic Metabolic Panel: Recent Labs  Lab 09/11/20 1442 09/12/20 0625 09/13/20 0509  NA 137 138 137  K 3.0* 3.2* 4.4  CL 96* 101 105  CO2 26 27 27   GLUCOSE 119* 96 77  BUN <5* <5* <5*  CREATININE 0.63 0.55 0.44  CALCIUM 9.1 8.1* 8.6*  MG 1.5*  --  1.5*  PHOS 4.9*  --   --    Liver Function Tests: Recent Labs  Lab 09/11/20 1442 09/13/20 0509  AST 186* 97*  ALT 54* 34  ALKPHOS 195* 146*  BILITOT 1.7* 1.5*  PROT 7.0 5.4*  ALBUMIN 3.2* 2.3*   No results for input(s): LIPASE, AMYLASE in the last 168 hours. No results for input(s): AMMONIA in the last 168 hours. CBC: Recent Labs  Lab 09/11/20 1442 09/12/20 0625 09/13/20 0509  WBC 6.6 4.5 5.1  NEUTROABS 4.1  --   --   HGB 11.8* 9.7* 9.5*  HCT 32.9*  28.3* 27.3*  MCV 103.8* 104.8* 105.8*  PLT 387 270 282   Cardiac Enzymes: No results for input(s): CKTOTAL, CKMB, CKMBINDEX, TROPONINI in the last 168 hours. BNP: Invalid input(s): POCBNP CBG: No results for input(s): GLUCAP in the last 168 hours. D-Dimer No results for input(s): DDIMER in the last 72 hours. Hgb A1c No results for input(s): HGBA1C in the last 72 hours. Lipid Profile No results for input(s): CHOL, HDL, LDLCALC, TRIG, CHOLHDL, LDLDIRECT in the last 72 hours. Thyroid function studies No results for input(s): TSH, T4TOTAL, T3FREE, THYROIDAB in the last 72 hours.  Invalid input(s): FREET3 Anemia work up Recent Labs    09/12/20 0045  VITAMINB12 161*  FOLATE 3.3*  FERRITIN 709*  TIBC 207*  IRON 185*   Urinalysis    Component Value Date/Time   COLORURINE RED (A) 09/11/2020 1448   APPEARANCEUR CLEAR (A) 09/11/2020 1448   LABSPEC 1.020 09/11/2020 1448   PHURINE 5.0 09/11/2020 1448   GLUCOSEU NEGATIVE 09/11/2020 1448   HGBUR NEGATIVE 09/11/2020 1448   BILIRUBINUR NEGATIVE 09/11/2020 1448   KETONESUR NEGATIVE 09/11/2020 1448   PROTEINUR 30 (A) 09/11/2020 1448   NITRITE POSITIVE (A) 09/11/2020 1448   LEUKOCYTESUR NEGATIVE 09/11/2020 1448   Sepsis Labs Invalid input(s): PROCALCITONIN,  WBC,  LACTICIDVEN Microbiology Recent Results (from the past 240 hour(s))  Blood culture (routine single)     Status: None (Preliminary result)   Collection Time: 09/11/20  2:42 PM   Specimen: BLOOD  Result Value Ref Range Status   Specimen Description BLOOD LEFT ANTECUBITAL  Final   Special Requests   Final    BOTTLES DRAWN AEROBIC AND ANAEROBIC Blood Culture results may not be optimal due to an excessive volume of blood received in culture bottles   Culture   Final    NO GROWTH 2 DAYS Performed at [facility], [address] [place], [place] [address]    Report Status PENDING  Incomplete  Urine Culture     Status: Abnormal   Collection Time: 09/11/20  2:48  PM   Specimen: In/Out Cath Urine  Result Value Ref Range Status   Specimen Description   Final    IN/OUT CATH URINE Performed at [facility], [address] [place], [place] [address]    Special Requests   Final    NONE Performed at [facility], [address]  [place], [place] [address]    Culture (A)  Final    40,000 COLONIES/mL DIPHTHEROIDS(CORYNEBACTERIUM SPECIES) Standardized susceptibility testing for this organism is not available. Performed at [facility], [address] [place], [place] [address]    Report Status 09/13/2020 FINAL  Final  Resp Panel by RT-PCR (Flu A&B, Covid) Nasopharyngeal Swab     Status: None   Collection Time: 09/11/20  3:52 PM   Specimen: Nasopharyngeal Swab; Nasopharyngeal(NP) swabs in vial transport medium  Result Value Ref Range Status   SARS Coronavirus 2 by RT PCR NEGATIVE NEGATIVE Final    Comment: (NOTE) SARS-CoV-2 target nucleic acids are NOT DETECTED.  The SARS-CoV-2 RNA is generally detectable in upper respiratory specimens during the acute phase of infection. The lowest concentration of SARS-CoV-2 viral copies this assay can detect is 138 copies/mL. A negative result does not preclude SARS-Cov-2 infection and should not be used as the sole basis for treatment or other patient management decisions. A negative result may occur with  improper specimen collection/handling, submission of specimen other than nasopharyngeal swab, presence of viral mutation(s) within the areas targeted by this assay, and inadequate number of viral copies(<138 copies/mL). A negative result must be combined with clinical observations, patient history, and epidemiological information. The expected result is Negative.  Fact Sheet for Patients:  BloggerCourse.com  Fact Sheet for Healthcare Providers:  SeriousBroker.it  This test is no t yet approved or cleared by the [place]  FDA and  has been authorized for detection and/or diagnosis of SARS-CoV-2 by FDA under an Emergency Use Authorization (EUA). This EUA will remain  in effect (meaning this test can be used) for the duration of the COVID-19 declaration under Section 564(b)(1) of the Act, 21 U.S.C.section 360bbb-3(b)(1), unless the authorization is terminated  or revoked sooner.       Influenza A by PCR NEGATIVE NEGATIVE Final   Influenza B by PCR NEGATIVE NEGATIVE Final    Comment: (NOTE) The Xpert Xpress SARS-CoV-2/FLU/RSV plus assay is intended as an aid in the diagnosis of influenza from Nasopharyngeal swab specimens and should not be used as a sole basis for treatment. Nasal washings and aspirates are unacceptable for Xpert Xpress SARS-CoV-2/FLU/RSV testing.  Fact Sheet for Patients: BloggerCourse.com  Fact Sheet for Healthcare Providers: SeriousBroker.it  This test is not yet approved or cleared by the [place] FDA and has been authorized for detection and/or diagnosis of SARS-CoV-2 by FDA under an Emergency Use Authorization (EUA). This EUA will remain in effect (meaning this test can be used) for the duration of the COVID-19 declaration under Section 564(b)(1) of the Act, 21 U.S.C. section 360bbb-3(b)(1), unless the authorization is terminated or revoked.  Performed at [facility], [address] [place], [place] [address]      Time coordinating discharge: Over 30 minutes  SIGNED:   Azucena Fallen, DO [facility] 09/13/2020, 2:57 PM Pager   If 7PM-7AM, please contact night-coverage [email or web address]

## 2020-09-13 NOTE — Progress Notes (Addendum)
Pt discharged per orders, PIV removed per order, wheelchair VIA volunteer services to front entrance leaving with spouse

## 2020-09-13 NOTE — TOC Initial Note (Signed)
Transition of Care [facility]) - Initial/Assessment Note    Patient Details  Name: Hailey Castaneda MRN: [id number] Date of Birth: April 23, 1985  Transition of Care [facility]) CM/SW Contact:    Allayne Butcher, RN Phone Number: 09/13/2020, 1:05 PM  Clinical Narrative:                 Patient admitted to the hospital with sepsis from UTI.  RNCM received consult for substance abuse counseling.  Patient endorses alcohol use but reports that she has never felt she had a problem- she reports drinking liquor 6 oz 3-4 times per week.   Patient lives with her husband and is independent at home and drives.    RNCM provided patient with substance abuse resources available in this area.    Expected Discharge Plan: Home/Self Care Barriers to Discharge: Continued Medical Work up   Patient Goals and CMS Choice        Expected Discharge Plan and Services Expected Discharge Plan: Home/Self Care   Discharge Planning Services: CM Consult   Living arrangements for the past 2 months: Single Family Home                 DME Arranged: N/A DME Agency: NA       HH Arranged: NA HH Agency: NA        Prior Living Arrangements/Services Living arrangements for the past 2 months: Single Family Home Lives with:: Minor Children, Spouse Patient language and need for interpreter reviewed:: Yes Do you feel safe going back to the place where you live?: Yes      Need for Family Participation in Patient Care: Yes (Comment) Care giver support system in place?: Yes (comment)   Criminal Activity/Legal Involvement Pertinent to Current Situation/Hospitalization: No - Comment as needed  Activities of Daily Living Home Assistive Devices/Equipment: None ADL Screening (condition at time of admission) Patient's cognitive ability adequate to safely complete daily activities?: Yes Is the patient deaf or have difficulty hearing?: No Does the patient have difficulty seeing, even when wearing glasses/contacts?: No Does the  patient have difficulty concentrating, remembering, or making decisions?: No Patient able to express need for assistance with ADLs?: Yes Does the patient have difficulty dressing or bathing?: No Independently performs ADLs?: Yes (appropriate for developmental age) Does the patient have difficulty walking or climbing stairs?: No Weakness of Legs: None Weakness of Arms/Hands: None  Permission Sought/Granted Permission sought to share information with : Case Manager, Family Supports Permission granted to share information with : Yes, Verbal Permission Granted  Share Information with NAME: Elnita Maxwell     Permission granted to share info w Relationship: Grandmother     Emotional Assessment Appearance:: Appears stated age Attitude/Demeanor/Rapport: Engaged Affect (typically observed): Accepting Orientation: : Oriented to Self, Oriented to Place, Oriented to  Time, Oriented to Situation Alcohol / Substance Use: Alcohol Use Psych Involvement: No (comment)  Admission diagnosis:  RUQ pain [R10.11] Transaminitis [R74.01] Sepsis secondary to UTI (HCC) [A41.9, N39.0] Patient Active Problem List   Diagnosis Date Noted   Sepsis secondary to UTI (HCC) 09/11/2020   Alcohol use disorder, moderate, dependence (HCC) 09/11/2020   Hepatic steatosis 09/11/2020   Hypokalemia 09/11/2020   Macrocytic anemia 09/11/2020   Alcohol abuse 09/22/2018   Psoriasis 09/22/2018   Bipolar 1 disorder (HCC) 04/17/2015   GAD (generalized anxiety disorder) 04/17/2015   PCP:  Larae Grooms, NP Pharmacy:   [id number] - [place], [place] - [address] AT [facility] [address] [place] [address] Phone: [phone] Fax: [phone]     Social Determinants of Health (SDOH) Interventions    Readmission Risk Interventions No flowsheet data found.

## 2020-09-16 LAB — CULTURE, BLOOD (SINGLE): Culture: NO GROWTH

## 2020-09-27 ENCOUNTER — Ambulatory Visit (INDEPENDENT_AMBULATORY_CARE_PROVIDER_SITE_OTHER): Payer: 59 | Admitting: Nurse Practitioner

## 2020-09-27 ENCOUNTER — Encounter: Payer: Self-pay | Admitting: Nurse Practitioner

## 2020-09-27 ENCOUNTER — Other Ambulatory Visit: Payer: Self-pay

## 2020-09-27 ENCOUNTER — Encounter: Payer: 59 | Admitting: Nurse Practitioner

## 2020-09-27 VITALS — BP 104/74 | HR 93 | Temp 98.9°F | Ht 65.0 in | Wt 125.2 lb

## 2020-09-27 DIAGNOSIS — F1020 Alcohol dependence, uncomplicated: Secondary | ICD-10-CM

## 2020-09-27 DIAGNOSIS — R7989 Other specified abnormal findings of blood chemistry: Secondary | ICD-10-CM

## 2020-09-27 DIAGNOSIS — E538 Deficiency of other specified B group vitamins: Secondary | ICD-10-CM | POA: Diagnosis not present

## 2020-09-27 DIAGNOSIS — Z0000 Encounter for general adult medical examination without abnormal findings: Secondary | ICD-10-CM

## 2020-09-27 DIAGNOSIS — R202 Paresthesia of skin: Secondary | ICD-10-CM

## 2020-09-27 DIAGNOSIS — R200 Anesthesia of skin: Secondary | ICD-10-CM | POA: Diagnosis not present

## 2020-09-27 DIAGNOSIS — F319 Bipolar disorder, unspecified: Secondary | ICD-10-CM

## 2020-09-27 DIAGNOSIS — Z124 Encounter for screening for malignant neoplasm of cervix: Secondary | ICD-10-CM

## 2020-09-27 LAB — URINALYSIS, ROUTINE W REFLEX MICROSCOPIC
Bilirubin, UA: NEGATIVE
Glucose, UA: NEGATIVE
Leukocytes,UA: NEGATIVE
Nitrite, UA: NEGATIVE
RBC, UA: NEGATIVE
Specific Gravity, UA: 1.025 (ref 1.005–1.030)
Urobilinogen, Ur: 0.2 mg/dL (ref 0.2–1.0)
pH, UA: 7.5 (ref 5.0–7.5)

## 2020-09-27 MED ORDER — GABAPENTIN 100 MG PO CAPS: 100.0000 mg | ORAL_CAPSULE | Freq: Every day | ORAL | 0 refills | Status: DC

## 2020-09-27 NOTE — Progress Notes (Deleted)
There were no vitals taken for this visit.   Subjective:    Patient ID: Hailey Castaneda, female    DOB: 1985-10-05, 35 y.o.   MRN: [id number]  HPI: Hailey Castaneda is a 35 y.o. female presenting on 09/27/2020 for comprehensive medical examination. Current medical complaints include:{Blank single:19197::"none","***"}  She currently lives with: Menopausal Symptoms: {Blank single:19197::"yes","no"}  Depression Screen done today and results listed below:  Depression screen [facility] 2/9 06/21/2020  Decreased Interest 0  Down, Depressed, Hopeless 0  PHQ - 2 Score 0    The patient {has/does not have:19849} a history of falls. I {did/did not:19850} complete a risk assessment for falls. A plan of care for falls {was/was not:19852} documented.   Past Medical History:  Past Medical History:  Diagnosis Date   Anxiety    Bipolar 1 disorder (HCC)    Sinus disorder     Surgical History:  Past Surgical History:  Procedure Laterality Date   lymph node removal     chain of 9, beign     Medications:  Current Outpatient Medications on File Prior to Visit  Medication Sig   cetirizine (ZYRTEC) 10 MG tablet Take 10 mg by mouth daily as needed for allergies.   cholecalciferol (VITAMIN D3) 25 MCG (1000 UNIT) tablet Take 1,000 Units by mouth daily.   folic acid (FOLVITE) 1 MG tablet Take 1 tablet (1 mg total) by mouth daily.   Ixekizumab 80 MG/ML SOSY Inject 80 mg into the skin every 30 (thirty) days.   lamoTRIgine (LAMICTAL) 150 MG tablet Take 225 mg by mouth daily.   Multiple Vitamin (MULTIVITAMIN WITH MINERALS) TABS tablet Take 1 tablet by mouth daily.   thiamine 100 MG tablet Take 1 tablet (100 mg total) by mouth daily.   No current facility-administered medications on file prior to visit.    Allergies:  Allergies  Allergen Reactions   Azithromycin Other (See Comments)   Penicillins Other (See Comments)    Social History:  Social History   Socioeconomic History   Marital status: Married     Spouse name: Not on file   Number of children: Not on file   Years of education: Not on file   Highest education level: Not on file  Occupational History   Not on file  Tobacco Use   Smoking status: Former    Types: Cigarettes    Quit date: 2011    Years since quitting: 11.6   Smokeless tobacco: Never  Vaping Use   Vaping Use: Never used  Substance and Sexual Activity   Alcohol use: Yes    Comment: social   Drug use: Never   Sexual activity: Yes    Birth control/protection: None  Other Topics Concern   Not on file  Social History Narrative   Not on file   Social Determinants of Health   Financial Resource Strain: Not on file  Food Insecurity: Not on file  Transportation Needs: Not on file  Physical Activity: Not on file  Stress: Not on file  Social Connections: Not on file  Intimate Partner Violence: Not on file   Social History   Tobacco Use  Smoking Status Former   Types: Cigarettes   Quit date: 2011   Years since quitting: 11.6  Smokeless Tobacco Never   Social History   Substance and Sexual Activity  Alcohol Use Yes   Comment: social    Family History:  Family History  Problem Relation Age of Onset   Fibromyalgia Mother  Heart disease Mother    Hypertension Mother    COPD Mother    Heart attack Mother    Cancer Maternal Grandmother        breast and ovarian   Heart disease Maternal Grandmother    Hypertension Maternal Grandmother    Osteopenia Paternal Grandmother     Past medical history, surgical history, medications, allergies, family history and social history reviewed with patient today and changes made to appropriate areas of the chart.   ROS All other ROS negative except what is listed above and in the HPI.      Objective:    There were no vitals taken for this visit.  Wt Readings from Last 3 Encounters:  09/11/20 124 lb (56.2 kg)  08/09/20 119 lb (54 kg)  06/21/20 127 lb 6 oz (57.8 kg)    Physical Exam  Results for  orders placed or performed during the hospital encounter of 09/11/20  Blood culture (routine single)   Specimen: BLOOD  Result Value Ref Range   Specimen Description BLOOD LEFT ANTECUBITAL    Special Requests      BOTTLES DRAWN AEROBIC AND ANAEROBIC Blood Culture results may not be optimal due to an excessive volume of blood received in culture bottles   Culture      NO GROWTH 5 DAYS Performed at [facility], [address], [place], [place] [address]    Report Status 09/16/2020 FINAL   Urine Culture   Specimen: In/Out Cath Urine  Result Value Ref Range   Specimen Description      IN/OUT CATH URINE Performed at [facility], [address] [place], [place] [address]    Special Requests      NONE Performed at [facility], [address] [place], [place] [address]    Culture (A)     40,000 COLONIES/mL DIPHTHEROIDS(CORYNEBACTERIUM SPECIES) Standardized susceptibility testing for this organism is not available. Performed at [facility], [address] [place], [place] [address]    Report Status 09/13/2020 FINAL   Resp Panel by RT-PCR (Flu A&B, Covid) Nasopharyngeal Swab   Specimen: Nasopharyngeal Swab; Nasopharyngeal(NP) swabs in vial transport medium  Result Value Ref Range   SARS Coronavirus 2 by RT PCR NEGATIVE NEGATIVE   Influenza A by PCR NEGATIVE NEGATIVE   Influenza B by PCR NEGATIVE NEGATIVE  Lactic acid, plasma  Result Value Ref Range   Lactic Acid, Venous 4.3 (HH) 0.5 - 1.9 mmol/L  Lactic acid, plasma  Result Value Ref Range   Lactic Acid, Venous 2.7 (HH) 0.5 - 1.9 mmol/L  Comprehensive metabolic panel  Result Value Ref Range   Sodium 137 135 - 145 mmol/L   Potassium 3.0 (L) 3.5 - 5.1 mmol/L   Chloride 96 (L) 98 - 111 mmol/L   CO2 26 22 - 32 mmol/L   Glucose, Bld 119 (H) 70 - 99 mg/dL   BUN <5 (L) 6 - 20 mg/dL   Creatinine, Ser 7.16 0.44 - 1.00 mg/dL   Calcium 9.1 8.9 - 96.7 mg/dL   Total Protein 7.0 6.5 - 8.1 g/dL    Albumin 3.2 (L) 3.5 - 5.0 g/dL   AST 893 (H) 15 - 41 U/L   ALT 54 (H) 0 - 44 U/L   Alkaline Phosphatase 195 (H) 38 - 126 U/L   Total Bilirubin 1.7 (H) 0.3 - 1.2 mg/dL   GFR, Estimated >81 >01 mL/min   Anion gap 15 5 - 15  CBC with Differential  Result  Value Ref Range   WBC 6.6 4.0 - 10.5 K/uL   RBC 3.17 (L) 3.87 - 5.11 MIL/uL   Hemoglobin 11.8 (L) 12.0 - 15.0 g/dL   HCT 16.1 (L) 09.6 - 04.5 %   MCV 103.8 (H) 80.0 - 100.0 fL   MCH 37.2 (H) 26.0 - 34.0 pg   MCHC 35.9 30.0 - 36.0 g/dL   RDW 40.9 (H) 81.1 - 91.4 %   Platelets 387 150 - 400 K/uL   nRBC 0.0 0.0 - 0.2 %   Neutrophils Relative % 63 %   Neutro Abs 4.1 1.7 - 7.7 K/uL   Lymphocytes Relative 22 %   Lymphs Abs 1.5 0.7 - 4.0 K/uL   Monocytes Relative 12 %   Monocytes Absolute 0.8 0.1 - 1.0 K/uL   Eosinophils Relative 2 %   Eosinophils Absolute 0.1 0.0 - 0.5 K/uL   Basophils Relative 1 %   Basophils Absolute 0.1 0.0 - 0.1 K/uL   Immature Granulocytes 0 %   Abs Immature Granulocytes 0.02 0.00 - 0.07 K/uL  Urinalysis, Complete w Microscopic  Result Value Ref Range   Color, Urine RED (A) YELLOW   APPearance CLEAR (A) CLEAR   Specific Gravity, Urine 1.020 1.005 - 1.030   pH 5.0 5.0 - 8.0   Glucose, UA NEGATIVE NEGATIVE mg/dL   Hgb urine dipstick NEGATIVE NEGATIVE   Bilirubin Urine NEGATIVE NEGATIVE   Ketones, ur NEGATIVE NEGATIVE mg/dL   Protein, ur 30 (A) NEGATIVE mg/dL   Nitrite POSITIVE (A) NEGATIVE   Leukocytes,Ua NEGATIVE NEGATIVE   RBC / HPF 0-5 0 - 5 RBC/hpf   WBC, UA 6-10 0 - 5 WBC/hpf   Bacteria, UA FEW (A) NONE SEEN   Squamous Epithelial / LPF 6-10 0 - 5   Mucus PRESENT   Protime-INR  Result Value Ref Range   Prothrombin Time 12.7 11.4 - 15.2 seconds   INR 1.0 0.8 - 1.2  APTT  Result Value Ref Range   aPTT 25 24 - 36 seconds  hCG, quantitative, pregnancy  Result Value Ref Range   hCG, Beta Chain, Quant, S 1 <5 mIU/mL  Magnesium  Result Value Ref Range   Magnesium 1.5 (L) 1.7 - 2.4 mg/dL   Phosphorus  Result Value Ref Range   Phosphorus 4.9 (H) 2.5 - 4.6 mg/dL  Protime-INR  Result Value Ref Range   Prothrombin Time 13.1 11.4 - 15.2 seconds   INR 1.0 0.8 - 1.2  Cortisol-am, blood  Result Value Ref Range   Cortisol - AM 13.2 6.7 - 22.6 ug/dL  Procalcitonin  Result Value Ref Range   Procalcitonin 0.28 ng/mL  Basic metabolic panel  Result Value Ref Range   Sodium 138 135 - 145 mmol/L   Potassium 3.2 (L) 3.5 - 5.1 mmol/L   Chloride 101 98 - 111 mmol/L   CO2 27 22 - 32 mmol/L   Glucose, Bld 96 70 - 99 mg/dL   BUN <5 (L) 6 - 20 mg/dL   Creatinine, Ser 7.82 0.44 - 1.00 mg/dL   Calcium 8.1 (L) 8.9 - 10.3 mg/dL   GFR, Estimated >95 >62 mL/min   Anion gap 10 5 - 15  HIV Antibody (routine testing w rflx)  Result Value Ref Range   HIV Screen 4th Generation wRfx Non Reactive Non Reactive  Vitamin B12  Result Value Ref Range   Vitamin B-12 161 (L) 180 - 914 pg/mL  Folate  Result Value Ref Range   Folate 3.3 (L) >5.9 ng/mL  Iron and TIBC  Result Value Ref Range   Iron 185 (H) 28 - 170 ug/dL   TIBC 914207 (L) 782250 - 956450 ug/dL   Saturation Ratios 89 (H) 10.4 - 31.8 %   UIBC 22 ug/dL  Ferritin  Result Value Ref Range   Ferritin 709 (H) 11 - 307 ng/mL  CBC  Result Value Ref Range   WBC 4.5 4.0 - 10.5 K/uL   RBC 2.70 (L) 3.87 - 5.11 MIL/uL   Hemoglobin 9.7 (L) 12.0 - 15.0 g/dL   HCT 21.328.3 (L) 08.636.0 - 57.846.0 %   MCV 104.8 (H) 80.0 - 100.0 fL   MCH 35.9 (H) 26.0 - 34.0 pg   MCHC 34.3 30.0 - 36.0 g/dL   RDW 46.920.7 (H) 62.911.5 - 52.815.5 %   Platelets 270 150 - 400 K/uL   nRBC 0.0 0.0 - 0.2 %  Comprehensive metabolic panel  Result Value Ref Range   Sodium 137 135 - 145 mmol/L   Potassium 4.4 3.5 - 5.1 mmol/L   Chloride 105 98 - 111 mmol/L   CO2 27 22 - 32 mmol/L   Glucose, Bld 77 70 - 99 mg/dL   BUN <5 (L) 6 - 20 mg/dL   Creatinine, Ser 4.130.44 0.44 - 1.00 mg/dL   Calcium 8.6 (L) 8.9 - 10.3 mg/dL   Total Protein 5.4 (L) 6.5 - 8.1 g/dL   Albumin 2.3 (L) 3.5 - 5.0 g/dL   AST 97  (H) 15 - 41 U/L   ALT 34 0 - 44 U/L   Alkaline Phosphatase 146 (H) 38 - 126 U/L   Total Bilirubin 1.5 (H) 0.3 - 1.2 mg/dL   GFR, Estimated >24>60 >40>60 mL/min   Anion gap 5 5 - 15  CBC  Result Value Ref Range   WBC 5.1 4.0 - 10.5 K/uL   RBC 2.58 (L) 3.87 - 5.11 MIL/uL   Hemoglobin 9.5 (L) 12.0 - 15.0 g/dL   HCT 10.227.3 (L) 72.536.0 - 36.646.0 %   MCV 105.8 (H) 80.0 - 100.0 fL   MCH 36.8 (H) 26.0 - 34.0 pg   MCHC 34.8 30.0 - 36.0 g/dL   RDW 44.020.8 (H) 34.711.5 - 42.515.5 %   Platelets 282 150 - 400 K/uL   nRBC 0.0 0.0 - 0.2 %  Magnesium  Result Value Ref Range   Magnesium 1.5 (L) 1.7 - 2.4 mg/dL  POC urine preg, ED  Result Value Ref Range   Preg Test, Ur NEGATIVE NEGATIVE      Assessment & Plan:   Problem List Items Addressed This Visit       Other   Bipolar 1 disorder (HCC)   GAD (generalized anxiety disorder)   Alcohol use disorder, moderate, dependence (HCC)   Other Visit Diagnoses     Annual physical exam    -  Primary   Screening for cervical cancer       Encounter for hepatitis C screening test for low risk patient            Follow up plan: No follow-ups on file.   LABORATORY TESTING:  - Pap smear: {Blank single:19197::"pap done","not applicable","up to date","done elsewhere"}  IMMUNIZATIONS:   - Tdap: Tetanus vaccination status reviewed: {tetanus status:315746}. - Influenza: Postponed to flu season - Pneumovax: Not applicable - Prevnar: Not applicable - HPV: {Blank single:19197::"Up to date","Administered today","Not applicable","Refused","Given elsewhere"} - Zostavax vaccine: Not applicable  SCREENING: -Mammogram: Not applicable  - Colonoscopy: Not applicable  - Bone Density: Not applicable  -Hearing Test:  Not applicable  -Spirometry: Not applicable   PATIENT COUNSELING:   Advised to take 1 mg of folate supplement per day if capable of pregnancy.   Sexuality: Discussed sexually transmitted diseases, partner selection, use of condoms, avoidance of unintended  pregnancy  and contraceptive alternatives.   Advised to avoid cigarette smoking.  I discussed with the patient that most people either abstain from alcohol or drink within safe limits (<=14/week and <=4 drinks/occasion for males, <=7/weeks and <= 3 drinks/occasion for females) and that the risk for alcohol disorders and other health effects rises proportionally with the number of drinks per week and how often a drinker exceeds daily limits.  Discussed cessation/primary prevention of drug use and availability of treatment for abuse.   Diet: Encouraged to adjust caloric intake to maintain  or achieve ideal body weight, to reduce intake of dietary saturated fat and total fat, to limit sodium intake by avoiding high sodium foods and not adding table salt, and to maintain adequate dietary potassium and calcium preferably from fresh fruits, vegetables, and low-fat dairy products.    stressed the importance of regular exercise  Injury prevention: Discussed safety belts, safety helmets, smoke detector, smoking near bedding or upholstery.   Dental health: Discussed importance of regular tooth brushing, flossing, and dental visits.    NEXT PREVENTATIVE PHYSICAL DUE IN 1 YEAR. No follow-ups on file.

## 2020-09-27 NOTE — Progress Notes (Signed)
BP 104/74   Pulse 93   Temp 98.9 F (37.2 C) (Oral)   Ht 5' 5"  (1.651 m)   Wt 125 lb 3.2 oz (56.8 kg)   SpO2 99%   BMI 20.83 kg/m    Subjective:    Patient ID: Hailey Castaneda, female    DOB: August 16, 1985, 35 y.o.   MRN: [id number]  HPI: Hailey Castaneda is a 35 y.o. female presenting on 09/27/2020 for comprehensive medical examination. Current medical complaints include:none  She currently lives with: Menopausal Symptoms: no  Patient was recently in the hospital due to sepsis.  Patient states she hasn't been drinking for the last 10 days.  She was given libirum for 4 days.  She is not currently having withdrawal.    She is still having neuropathy but has been started on thiamine and is aware of her b12 deficiency. Patient would like to try something for the neuropathy.  ANXIETY/ BIPOLAR Patient has been on Lamictal since 2009.  States it is working well for her.  She states her mood is controlled with the medication.    Denies HA, CP, SOB, dizziness, palpitations, visual changes, and lower extremity swelling.  Depression Screen done today and results listed below:  Depression screen [facility] 2/9 06/21/2020  Decreased Interest 0  Down, Depressed, Hopeless 0  PHQ - 2 Score 0    The patient does not have a history of falls. I did complete a risk assessment for falls. A plan of care for falls was documented.   Past Medical History:  Past Medical History:  Diagnosis Date   Anxiety    Bipolar 1 disorder ([place])    Sinus disorder     Surgical History:  Past Surgical History:  Procedure Laterality Date   lymph node removal     chain of 9, beign     Medications:  Current Outpatient Medications on File Prior to Visit  Medication Sig   cetirizine (ZYRTEC) 10 MG tablet Take 10 mg by mouth daily as needed for allergies.   cholecalciferol (VITAMIN D3) 25 MCG (1000 UNIT) tablet Take 1,000 Units by mouth daily.   folic acid (FOLVITE) 1 MG tablet Take 1 tablet (1 mg total) by mouth daily.    Ixekizumab 80 MG/ML SOSY Inject 80 mg into the skin every 30 (thirty) days.   lamoTRIgine (LAMICTAL) 150 MG tablet Take 225 mg by mouth daily.   Multiple Vitamin (MULTIVITAMIN WITH MINERALS) TABS tablet Take 1 tablet by mouth daily.   thiamine 100 MG tablet Take 1 tablet (100 mg total) by mouth daily.   No current facility-administered medications on file prior to visit.    Allergies:  Allergies  Allergen Reactions   Azithromycin Other (See Comments)   Penicillins Other (See Comments)    Social History:  Social History   Socioeconomic History   Marital status: Married    Spouse name: Not on file   Number of children: Not on file   Years of education: Not on file   Highest education level: Not on file  Occupational History   Not on file  Tobacco Use   Smoking status: Former    Types: Cigarettes    Quit date: 2011    Years since quitting: 11.6   Smokeless tobacco: Never  Vaping Use   Vaping Use: Never used  Substance and Sexual Activity   Alcohol use: Yes    Comment: social   Drug use: Never   Sexual activity: Yes    Birth  control/protection: None  Other Topics Concern   Not on file  Social History Narrative   Not on file   Social Determinants of Health   Financial Resource Strain: Not on file  Food Insecurity: Not on file  Transportation Needs: Not on file  Physical Activity: Not on file  Stress: Not on file  Social Connections: Not on file  Intimate Partner Violence: Not on file   Social History   Tobacco Use  Smoking Status Former   Types: Cigarettes   Quit date: 2011   Years since quitting: 11.6  Smokeless Tobacco Never   Social History   Substance and Sexual Activity  Alcohol Use Yes   Comment: social    Family History:  Family History  Problem Relation Age of Onset   Fibromyalgia Mother    Heart disease Mother    Hypertension Mother    COPD Mother    Heart attack Mother    Cancer Maternal Grandmother        breast and ovarian    Heart disease Maternal Grandmother    Hypertension Maternal Grandmother    Osteopenia Paternal Grandmother     Past medical history, surgical history, medications, allergies, family history and social history reviewed with patient today and changes made to appropriate areas of the chart.   Review of Systems  Eyes:  Negative for blurred vision and double vision.  Respiratory:  Negative for shortness of breath.   Cardiovascular:  Negative for chest pain, palpitations and leg swelling.  Neurological:  Positive for tingling. Negative for dizziness and headaches.  Psychiatric/Behavioral:  Negative for depression and suicidal ideas. The patient is not nervous/anxious.   All other ROS negative except what is listed above and in the HPI.      Objective:    BP 104/74   Pulse 93   Temp 98.9 F (37.2 C) (Oral)   Ht 5' 5"  (1.651 m)   Wt 125 lb 3.2 oz (56.8 kg)   SpO2 99%   BMI 20.83 kg/m   Wt Readings from Last 3 Encounters:  09/27/20 125 lb 3.2 oz (56.8 kg)  09/11/20 124 lb (56.2 kg)  08/09/20 119 lb (54 kg)    Physical Exam Vitals and nursing note reviewed. Exam conducted with a chaperone present Jodie Echevaria, CMA).  Constitutional:      General: She is awake. She is not in acute distress.    Appearance: She is well-developed. She is not ill-appearing.  HENT:     Head: Normocephalic and atraumatic.     Right Ear: Hearing, tympanic membrane, ear canal and external ear normal. No drainage.     Left Ear: Hearing, tympanic membrane, ear canal and external ear normal. No drainage.     Nose: Nose normal.     Right Sinus: No maxillary sinus tenderness or frontal sinus tenderness.     Left Sinus: No maxillary sinus tenderness or frontal sinus tenderness.     Mouth/Throat:     Mouth: Mucous membranes are moist.     Pharynx: Oropharynx is clear. Uvula midline. No pharyngeal swelling, oropharyngeal exudate or posterior oropharyngeal erythema.  Eyes:     General: Lids are normal.         Right eye: No discharge.        Left eye: No discharge.     Extraocular Movements: Extraocular movements intact.     Conjunctiva/sclera: Conjunctivae normal.     Pupils: Pupils are equal, round, and reactive to light.     Visual  Fields: Right eye visual fields normal and left eye visual fields normal.  Neck:     Thyroid: No thyromegaly.     Vascular: No carotid bruit.     Trachea: Trachea normal.  Cardiovascular:     Rate and Rhythm: Normal rate and regular rhythm.     Heart sounds: Normal heart sounds. No murmur heard.   No gallop.  Pulmonary:     Effort: Pulmonary effort is normal. No accessory muscle usage or respiratory distress.     Breath sounds: Normal breath sounds.  Chest:  Breasts:    Right: Normal.     Left: Normal.  Abdominal:     General: Bowel sounds are normal.     Palpations: Abdomen is soft. There is no hepatomegaly or splenomegaly.     Tenderness: There is no abdominal tenderness.  Genitourinary:    Vagina: Normal.     Cervix: Normal.     Adnexa: Right adnexa normal and left adnexa normal.  Musculoskeletal:        General: Normal range of motion.     Cervical back: Normal range of motion and neck supple.     Right lower leg: No edema.     Left lower leg: No edema.  Lymphadenopathy:     Head:     Right side of head: No submental, submandibular, tonsillar, preauricular or posterior auricular adenopathy.     Left side of head: No submental, submandibular, tonsillar, preauricular or posterior auricular adenopathy.     Cervical: No cervical adenopathy.     Upper Body:     Right upper body: No supraclavicular, axillary or pectoral adenopathy.     Left upper body: No supraclavicular, axillary or pectoral adenopathy.  Skin:    General: Skin is warm and dry.     Capillary Refill: Capillary refill takes less than 2 seconds.     Findings: No rash.  Neurological:     Mental Status: She is alert and oriented to person, place, and time.     Cranial Nerves: Cranial  nerves are intact.     Gait: Gait is intact.     Deep Tendon Reflexes: Reflexes are normal and symmetric.     Reflex Scores:      Brachioradialis reflexes are 2+ on the right side and 2+ on the left side.      Patellar reflexes are 2+ on the right side and 2+ on the left side. Psychiatric:        Attention and Perception: Attention normal.        Mood and Affect: Mood normal.        Speech: Speech normal.        Behavior: Behavior normal. Behavior is cooperative.        Thought Content: Thought content normal.        Judgment: Judgment normal.    Results for orders placed or performed in visit on 09/27/20  CBC with Differential/Platelet  Result Value Ref Range   WBC 6.9 3.4 - 10.8 x10E3/uL   RBC 3.28 (L) 3.77 - 5.28 x10E6/uL   Hemoglobin 11.0 (L) 11.1 - 15.9 g/dL   Hematocrit 33.6 (L) 34.0 - 46.6 %   MCV 102 (H) 79 - 97 fL   MCH 33.5 (H) 26.6 - 33.0 pg   MCHC 32.7 31.5 - 35.7 g/dL   RDW 15.4 11.7 - 15.4 %   Platelets 257 150 - 450 x10E3/uL   Neutrophils 46 Not Estab. %   Lymphs 36 Not Estab. %  Monocytes 13 Not Estab. %   Eos 3 Not Estab. %   Basos 2 Not Estab. %   Neutrophils Absolute 3.1 1.4 - 7.0 x10E3/uL   Lymphocytes Absolute 2.5 0.7 - 3.1 x10E3/uL   Monocytes Absolute 0.9 0.1 - 0.9 x10E3/uL   EOS (ABSOLUTE) 0.2 0.0 - 0.4 x10E3/uL   Basophils Absolute 0.2 0.0 - 0.2 x10E3/uL   Immature Granulocytes 0 Not Estab. %   Immature Grans (Abs) 0.0 0.0 - 0.1 x10E3/uL  Comprehensive metabolic panel  Result Value Ref Range   Glucose 94 65 - 99 mg/dL   BUN 8 6 - 20 mg/dL   Creatinine, Ser 0.71 0.57 - 1.00 mg/dL   eGFR 114 >59 mL/min/1.73   BUN/Creatinine Ratio 11 9 - 23   Sodium 141 134 - 144 mmol/L   Potassium 4.4 3.5 - 5.2 mmol/L   Chloride 101 96 - 106 mmol/L   CO2 26 20 - 29 mmol/L   Calcium 9.3 8.7 - 10.2 mg/dL   Total Protein 6.9 6.0 - 8.5 g/dL   Albumin 3.4 (L) 3.8 - 4.8 g/dL   Globulin, Total 3.5 1.5 - 4.5 g/dL   Albumin/Globulin Ratio 1.0 (L) 1.2 - 2.2    Bilirubin Total 0.3 0.0 - 1.2 mg/dL   Alkaline Phosphatase 151 (H) 44 - 121 IU/L   AST 72 (H) 0 - 40 IU/L   ALT 24 0 - 32 IU/L  Lipid panel  Result Value Ref Range   Cholesterol, Total 257 (H) 100 - 199 mg/dL   Triglycerides 261 (H) 0 - 149 mg/dL   HDL 50 >39 mg/dL   VLDL Cholesterol Cal 48 (H) 5 - 40 mg/dL   LDL Chol Calc (NIH) 159 (H) 0 - 99 mg/dL   Chol/HDL Ratio 5.1 (H) 0.0 - 4.4 ratio  TSH  Result Value Ref Range   TSH 6.230 (H) 0.450 - 4.500 uIU/mL  Urinalysis, Routine w reflex microscopic  Result Value Ref Range   Specific Gravity, UA 1.025 1.005 - 1.030   pH, UA 7.5 5.0 - 7.5   Color, UA Yellow Yellow   Appearance Ur Cloudy (A) Clear   Leukocytes,UA Negative Negative   Protein,UA Trace (A) Negative/Trace   Glucose, UA Negative Negative   Ketones, UA Trace (A) Negative   RBC, UA Negative Negative   Bilirubin, UA Negative Negative   Urobilinogen, Ur 0.2 0.2 - 1.0 mg/dL   Nitrite, UA Negative Negative  B12  Result Value Ref Range   Vitamin B-12 412 232 - 1,245 pg/mL      Assessment & Plan:   Problem List Items Addressed This Visit       Other   Bipolar 1 disorder ([facility])    Chronic.  Controlled.  Continue with current medication regimen.  Labs ordered today.  Return to clinic in 6 months for reevaluation.  Call sooner if concerns arise.        Alcohol use disorder, moderate, dependence ([facility])    Chronic. Patient has not drank in 10 days.  Aware that most of her symptoms are related to drinking heavily for the last 3 years.  Not currently having any withdrawal symptoms.       Numbness and tingling    Chronic. Will start Gabapentin 128m QHS. Discussed side effects and benefits of medication with patient during visit. Discussed that symptoms may improve with B12 level increasing. Will follow up in 1 month and adjust medication as needed.       B12 deficiency  Labs ordered today.  Will make recommendations based on lab results. Patient is taking Thiamine as  recommended during her recent hospitalization.      Relevant Orders   B12 (Completed)   Other Visit Diagnoses     Annual physical exam    -  Primary   Health maintenance reviewed during visit today. Labs ordered today. TDAP given. PAP done.    Relevant Orders   CBC with Differential/Platelet (Completed)   Comprehensive metabolic panel (Completed)   Lipid panel (Completed)   TSH (Completed)   Urinalysis, Routine w reflex microscopic (Completed)   Screening for cervical cancer       PAP obtained in normal fashion. Patient tolerated obtaining of PAP without complication. Escorted by Jodie Echevaria, [place].   Relevant Orders   Cytology - PAP        Follow up plan: Return in about 1 month (around 10/27/2020) for Neuropathy (telehealth).   LABORATORY TESTING:  - Pap smear: pap done  IMMUNIZATIONS:   - Tdap: Tetanus vaccination status reviewed: given today. - Influenza: Postponed to flu season - Pneumovax: Not applicable - Prevnar: Not applicable - HPV: Refused - Zostavax vaccine: Not applicable  SCREENING: -Mammogram: Not applicable  - Colonoscopy: Not applicable  - Bone Density: Not applicable  -Hearing Test: Not applicable  -Spirometry: Not applicable   PATIENT COUNSELING:   Advised to take 1 mg of folate supplement per day if capable of pregnancy.   Sexuality: Discussed sexually transmitted diseases, partner selection, use of condoms, avoidance of unintended pregnancy  and contraceptive alternatives.   Advised to avoid cigarette smoking.  I discussed with the patient that most people either abstain from alcohol or drink within safe limits (<=14/week and <=4 drinks/occasion for males, <=7/weeks and <= 3 drinks/occasion for females) and that the risk for alcohol disorders and other health effects rises proportionally with the number of drinks per week and how often a drinker exceeds daily limits.  Discussed cessation/primary prevention of drug use and availability of  treatment for abuse.   Diet: Encouraged to adjust caloric intake to maintain  or achieve ideal body weight, to reduce intake of dietary saturated fat and total fat, to limit sodium intake by avoiding high sodium foods and not adding table salt, and to maintain adequate dietary potassium and calcium preferably from fresh fruits, vegetables, and low-fat dairy products.    stressed the importance of regular exercise  Injury prevention: Discussed safety belts, safety helmets, smoke detector, smoking near bedding or upholstery.   Dental health: Discussed importance of regular tooth brushing, flossing, and dental visits.    NEXT PREVENTATIVE PHYSICAL DUE IN 1 YEAR. Return in about 1 month (around 10/27/2020) for Neuropathy (telehealth).

## 2020-09-28 DIAGNOSIS — R202 Paresthesia of skin: Secondary | ICD-10-CM | POA: Insufficient documentation

## 2020-09-28 DIAGNOSIS — E538 Deficiency of other specified B group vitamins: Secondary | ICD-10-CM | POA: Insufficient documentation

## 2020-09-28 DIAGNOSIS — R200 Anesthesia of skin: Secondary | ICD-10-CM | POA: Insufficient documentation

## 2020-09-28 LAB — COMPREHENSIVE METABOLIC PANEL
ALT: 24 IU/L (ref 0–32)
AST: 72 IU/L — ABNORMAL HIGH (ref 0–40)
Albumin/Globulin Ratio: 1 — ABNORMAL LOW (ref 1.2–2.2)
Albumin: 3.4 g/dL — ABNORMAL LOW (ref 3.8–4.8)
Alkaline Phosphatase: 151 IU/L — ABNORMAL HIGH (ref 44–121)
BUN/Creatinine Ratio: 11 (ref 9–23)
BUN: 8 mg/dL (ref 6–20)
Bilirubin Total: 0.3 mg/dL (ref 0.0–1.2)
CO2: 26 mmol/L (ref 20–29)
Calcium: 9.3 mg/dL (ref 8.7–10.2)
Chloride: 101 mmol/L (ref 96–106)
Creatinine, Ser: 0.71 mg/dL (ref 0.57–1.00)
Globulin, Total: 3.5 g/dL (ref 1.5–4.5)
Glucose: 94 mg/dL (ref 65–99)
Potassium: 4.4 mmol/L (ref 3.5–5.2)
Sodium: 141 mmol/L (ref 134–144)
Total Protein: 6.9 g/dL (ref 6.0–8.5)
eGFR: 114 mL/min/{1.73_m2} (ref >59)

## 2020-09-28 LAB — CBC WITH DIFFERENTIAL/PLATELET
Basophils Absolute: 0.2 10*3/uL (ref 0.0–0.2)
Basos: 2 %
EOS (ABSOLUTE): 0.2 10*3/uL (ref 0.0–0.4)
Eos: 3 %
Hematocrit: 33.6 % — ABNORMAL LOW (ref 34.0–46.6)
Hemoglobin: 11 g/dL — ABNORMAL LOW (ref 11.1–15.9)
Immature Grans (Abs): 0 10*3/uL (ref 0.0–0.1)
Immature Granulocytes: 0 %
Lymphocytes Absolute: 2.5 10*3/uL (ref 0.7–3.1)
Lymphs: 36 %
MCH: 33.5 pg — ABNORMAL HIGH (ref 26.6–33.0)
MCHC: 32.7 g/dL (ref 31.5–35.7)
MCV: 102 fL — ABNORMAL HIGH (ref 79–97)
Monocytes Absolute: 0.9 10*3/uL (ref 0.1–0.9)
Monocytes: 13 %
Neutrophils Absolute: 3.1 10*3/uL (ref 1.4–7.0)
Neutrophils: 46 %
Platelets: 257 10*3/uL (ref 150–450)
RBC: 3.28 x10E6/uL — ABNORMAL LOW (ref 3.77–5.28)
RDW: 15.4 % (ref 11.7–15.4)
WBC: 6.9 10*3/uL (ref 3.4–10.8)

## 2020-09-28 LAB — LIPID PANEL
Chol/HDL Ratio: 5.1 ratio — ABNORMAL HIGH (ref 0.0–4.4)
Cholesterol, Total: 257 mg/dL — ABNORMAL HIGH (ref 100–199)
HDL: 50 mg/dL (ref >39)
LDL Chol Calc (NIH): 159 mg/dL — ABNORMAL HIGH (ref 0–99)
Triglycerides: 261 mg/dL — ABNORMAL HIGH (ref 0–149)
VLDL Cholesterol Cal: 48 mg/dL — ABNORMAL HIGH (ref 5–40)

## 2020-09-28 LAB — VITAMIN B12: Vitamin B-12: 412 pg/mL (ref 232–1245)

## 2020-09-28 LAB — TSH: TSH: 6.23 u[IU]/mL — ABNORMAL HIGH (ref 0.450–4.500)

## 2020-09-28 NOTE — Assessment & Plan Note (Addendum)
Chronic. Will start Gabapentin 100mg  QHS. Discussed side effects and benefits of medication with patient during visit. Discussed that symptoms may improve with B12 level increasing. Will follow up in 1 month and adjust medication as needed.

## 2020-09-28 NOTE — Addendum Note (Signed)
Addended by: Larae Grooms on: 09/28/2020 10:35 AM   Modules accepted: Orders

## 2020-09-28 NOTE — Assessment & Plan Note (Signed)
Labs ordered today.  Will make recommendations based on lab results. Patient is taking Thiamine as recommended during her recent hospitalization.

## 2020-09-28 NOTE — Progress Notes (Signed)
Hi Hailey Castaneda.  It ws good to see yo yesterday.  Your lab work is improving from your hospitalization which is great news  Your anemia is continuing to improve.  Your liver enzymes (AST/ALT) have improved which is great.  Your cholesterol is elevated.  I recommend a low fat diet.  Your thyroid labs also came back abnormal so I would like you to come back and repeat further thyroid testing to see if this could be contributing to some of your symptoms. Your B12 improved which is great news. Your urinalysis has some ketones and protein but this is likely related to you stopping drinking.  We will continue to monitor this in the future.    Please make a lab appointment to come back and repeat those labs.  See you soon.

## 2020-09-28 NOTE — Assessment & Plan Note (Signed)
Chronic.  Controlled.  Continue with current medication regimen.  Labs ordered today.  Return to clinic in 6 months for reevaluation.  Call sooner if concerns arise.  ? ?

## 2020-09-28 NOTE — Assessment & Plan Note (Signed)
Chronic. Patient has not drank in 10 days.  Aware that most of her symptoms are related to drinking heavily for the last 3 years.  Not currently having any withdrawal symptoms.

## 2020-10-03 ENCOUNTER — Other Ambulatory Visit: Payer: Self-pay

## 2020-10-03 ENCOUNTER — Ambulatory Visit (INDEPENDENT_AMBULATORY_CARE_PROVIDER_SITE_OTHER): Payer: 59

## 2020-10-03 DIAGNOSIS — R9431 Abnormal electrocardiogram [ECG] [EKG]: Secondary | ICD-10-CM

## 2020-10-03 DIAGNOSIS — R000 Tachycardia, unspecified: Secondary | ICD-10-CM

## 2020-10-03 MED ORDER — PERFLUTREN LIPID MICROSPHERE
1.0000 mL | INTRAVENOUS | Status: AC | PRN
Start: 2020-10-03 — End: 2020-10-03
  Administered 2020-10-03: 2 mL via INTRAVENOUS

## 2020-10-04 LAB — CYTOLOGY - PAP: Adequacy: ABNORMAL

## 2020-10-04 LAB — ECHOCARDIOGRAM COMPLETE
AR max vel: 2.87 cm2
AV Area VTI: 2.97 cm2
AV Area mean vel: 2.48 cm2
AV Mean grad: 2 mmHg
AV Peak grad: 2.8 mmHg
Ao pk vel: 0.83 m/s
Calc EF: 68.6 %
S' Lateral: 2.65 cm
Single Plane A2C EF: 68.8 %
Single Plane A4C EF: 68.3 %

## 2020-10-04 NOTE — Progress Notes (Signed)
Hi Hailey Castaneda. The PAP sample was not satisfactory.  I was not able to get the proper cells for the PAP specimen.  I recommend you see GYN to have it repeated.

## 2020-10-06 ENCOUNTER — Telehealth: Payer: Self-pay

## 2020-10-06 ENCOUNTER — Other Ambulatory Visit: Payer: Self-pay

## 2020-10-06 ENCOUNTER — Other Ambulatory Visit: Payer: 59

## 2020-10-06 DIAGNOSIS — E038 Other specified hypothyroidism: Secondary | ICD-10-CM

## 2020-10-06 DIAGNOSIS — R7989 Other specified abnormal findings of blood chemistry: Secondary | ICD-10-CM

## 2020-10-06 NOTE — Telephone Encounter (Signed)
Was able to reach out to pt via phone and make contact to review their recent ZIO monitor results. Dr. Mariah Milling advised based on the current results   Echocardiogram  Normal cardiac function  No significant valve disease  Normal cardiac pressures   Pt verbalized understanding, is thankful for the results call, all questions and concerns were address.   Pt did asked question regarding her ZIO monitor, reports her insurance has cover everything pertaining to Dr. Mariah Milling and the OV, however, with the ZIO, she reports her insurance told her it was "out-of-network", so there are unable to cover cost. Advised unable to correct that on our end, unsure what the "out-pf-network" entails from the ZIO, gave number to [employer] to call to see if this can be adjusted.   Hailey Castaneda thankful for the call and info on ZIO. Will call back for anything further, f/u as schedule.

## 2020-10-07 LAB — THYROID PANEL WITH TSH
Free Thyroxine Index: 1.4 (ref 1.2–4.9)
T3 Uptake Ratio: 23 % — ABNORMAL LOW (ref 24–39)
T4, Total: 6 ug/dL (ref 4.5–12.0)
TSH: 7.17 u[IU]/mL — ABNORMAL HIGH (ref 0.450–4.500)

## 2020-10-09 MED ORDER — LEVOTHYROXINE SODIUM 25 MCG PO TABS
25.0000 ug | ORAL_TABLET | Freq: Every day | ORAL | 1 refills | Status: DC
Start: 2020-10-09 — End: 2022-03-14

## 2020-10-09 NOTE — Progress Notes (Signed)
Medication sent to the pharmacy. Please make her a lab appt for 8 weeks.

## 2020-10-09 NOTE — Progress Notes (Signed)
Hi Hailey Castaneda.  Your lab work shows that you are experiencing some subclinical hypothyroidism.  This could be contributing to some of your symptoms. I'd like to start Levothyroxine daily.  You would take this first thing in the morning with only water and waiting at least 30 minutes before eating, drinking, or taking any other medications.  If you agree I will send this to the pharmacy and I will have you come back in 8 weeks to recheck your lab work.

## 2020-10-16 ENCOUNTER — Other Ambulatory Visit: Payer: Self-pay

## 2020-10-16 ENCOUNTER — Encounter: Payer: Self-pay | Admitting: Nurse Practitioner

## 2020-10-16 ENCOUNTER — Ambulatory Visit (INDEPENDENT_AMBULATORY_CARE_PROVIDER_SITE_OTHER): Payer: 59 | Admitting: Nurse Practitioner

## 2020-10-16 VITALS — BP 135/99 | HR 94 | Temp 98.7°F | Wt 121.4 lb

## 2020-10-16 DIAGNOSIS — R3915 Urgency of urination: Secondary | ICD-10-CM | POA: Diagnosis not present

## 2020-10-16 DIAGNOSIS — R200 Anesthesia of skin: Secondary | ICD-10-CM

## 2020-10-16 DIAGNOSIS — R202 Paresthesia of skin: Secondary | ICD-10-CM

## 2020-10-16 DIAGNOSIS — N3000 Acute cystitis without hematuria: Secondary | ICD-10-CM | POA: Diagnosis not present

## 2020-10-16 LAB — URINALYSIS, ROUTINE W REFLEX MICROSCOPIC
Bilirubin, UA: NEGATIVE
Glucose, UA: NEGATIVE
Leukocytes,UA: NEGATIVE
Nitrite, UA: NEGATIVE
RBC, UA: NEGATIVE
Specific Gravity, UA: >1.03 — ABNORMAL HIGH (ref 1.005–1.030)
Urobilinogen, Ur: 0.2 mg/dL (ref 0.2–1.0)
pH, UA: 5.5 (ref 5.0–7.5)

## 2020-10-16 LAB — MICROSCOPIC EXAMINATION
RBC, Urine: NONE SEEN /hpf (ref 0–2)
WBC, UA: NONE SEEN /hpf (ref 0–5)

## 2020-10-16 MED ORDER — GABAPENTIN 300 MG PO CAPS: 300.0000 mg | ORAL_CAPSULE | Freq: Two times a day (BID) | ORAL | 1 refills | Status: DC

## 2020-10-16 MED ORDER — NITROFURANTOIN MONOHYD MACRO 100 MG PO CAPS: 100.0000 mg | ORAL_CAPSULE | Freq: Two times a day (BID) | ORAL | 0 refills | Status: DC

## 2020-10-16 NOTE — Progress Notes (Signed)
BP (!) 135/99   Pulse 94   Temp 98.7 F (37.1 C) (Oral)   Wt 121 lb 6.4 oz (55.1 kg)   SpO2 99%   BMI 20.20 kg/m    Subjective:    Patient ID: Hailey Castaneda, female    DOB: [birth year], 35 y.o.   MRN: [id number]  HPI: Hailey Castaneda is a 35 y.o. female  Chief Complaint  Patient presents with   Urinary Tract Infection    Pt states she has been having urinary urgency and lower abdominal pain since Thursday    URINARY SYMPTOMS Dysuria: no Urinary frequency: yes Urgency: yes Small volume voids: yes Symptom severity: yes Urinary incontinence: no Foul odor: no Hematuria: no Abdominal pain: yes Back pain: no Suprapubic pain/pressure: no Flank pain: no Fever:  no Vomiting: no Relief with cranberry juice: no Relief with pyridium: no Status: stable Previous urinary tract infection: yes Recurrent urinary tract infection: no History of sexually transmitted disease: no Penile discharge: no Treatments attempted: none   NEUROPATHY Patient states that her neuropathy has improved with the gabapentin. Wanting to know if we can increase the dose.    Relevant past medical, surgical, family and social history reviewed and updated as indicated. Interim medical history since our last visit reviewed. Allergies and medications reviewed and updated.  Review of Systems  Constitutional:  Negative for fever.  Gastrointestinal:  Negative for abdominal pain and vomiting.  Genitourinary:  Positive for decreased urine volume, frequency and urgency. Negative for dysuria, flank pain and hematuria.  Musculoskeletal:  Negative for back pain.   Per HPI unless specifically indicated above     Objective:    BP (!) 135/99   Pulse 94   Temp 98.7 F (37.1 C) (Oral)   Wt 121 lb 6.4 oz (55.1 kg)   SpO2 99%   BMI 20.20 kg/m   Wt Readings from Last 3 Encounters:  10/16/20 121 lb 6.4 oz (55.1 kg)  09/27/20 125 lb 3.2 oz (56.8 kg)  09/11/20 124 lb (56.2 kg)    Physical Exam Vitals and  nursing note reviewed.  Constitutional:      General: She is not in acute distress.    Appearance: Normal appearance. She is normal weight. She is not ill-appearing, toxic-appearing or diaphoretic.  HENT:     Head: Normocephalic.     Right Ear: External ear normal.     Left Ear: External ear normal.     Nose: Nose normal.     Mouth/Throat:     Mouth: Mucous membranes are moist.     Pharynx: Oropharynx is clear.  Eyes:     General:        Right eye: No discharge.        Left eye: No discharge.     Extraocular Movements: Extraocular movements intact.     Conjunctiva/sclera: Conjunctivae normal.     Pupils: Pupils are equal, round, and reactive to light.  Cardiovascular:     Rate and Rhythm: Normal rate and regular rhythm.     Heart sounds: No murmur heard. Pulmonary:     Effort: Pulmonary effort is normal. No respiratory distress.     Breath sounds: Normal breath sounds. No wheezing or rales.  Musculoskeletal:     Cervical back: Normal range of motion and neck supple.  Skin:    General: Skin is warm and dry.     Capillary Refill: Capillary refill takes less than 2 seconds.  Neurological:     General: No  focal deficit present.     Mental Status: She is alert and oriented to person, place, and time. Mental status is at baseline.  Psychiatric:        Mood and Affect: Mood normal.        Behavior: Behavior normal.        Thought Content: Thought content normal.        Judgment: Judgment normal.    Results for orders placed or performed in visit on 10/06/20  Thyroid Panel With TSH  Result Value Ref Range   TSH 7.170 (H) 0.450 - 4.500 uIU/mL   T4, Total 6.0 4.5 - 12.0 ug/dL   T3 Uptake Ratio 23 (L) 24 - 39 %   Free Thyroxine Index 1.4 1.2 - 4.9      Assessment & Plan:   Problem List Items Addressed This Visit       Other   Numbness and tingling    Will increase Gabapentin to 300mg  BID. Will keep follow up in 1 month.        Other Visit Diagnoses     Acute  cystitis without hematuria    -  Primary   Treated with Macrobid due to symptoms. Sent for culture. Will make recommendations based on lab results.    Relevant Orders   Urine Culture   Urinary urgency       Relevant Orders   Urinalysis, Routine w reflex microscopic        Follow up plan: Return if symptoms worsen or fail to improve.

## 2020-10-16 NOTE — Assessment & Plan Note (Signed)
Will increase Gabapentin to 300mg  BID. Will keep follow up in 1 month.

## 2020-10-17 MED ORDER — CETIRIZINE HCL 10 MG PO TABS: 10.0000 mg | ORAL_TABLET | Freq: Every day | ORAL | 1 refills | Status: DC | PRN

## 2020-10-17 MED ORDER — LAMOTRIGINE 150 MG PO TABS: 225.0000 mg | ORAL_TABLET | Freq: Every day | ORAL | 1 refills | Status: DC

## 2020-10-17 MED ORDER — FOLIC ACID 1 MG PO TABS: 1.0000 mg | ORAL_TABLET | Freq: Every day | ORAL | 1 refills | Status: DC

## 2020-10-17 NOTE — Progress Notes (Signed)
Results discussed with patient during visit today. Sent for culture.

## 2020-10-19 LAB — URINE CULTURE: Organism ID, Bacteria: NO GROWTH

## 2020-10-19 NOTE — Progress Notes (Signed)
Hi Hailey Castaneda.  Your urine did not grow any bacteria.  You can stop the antibiotic if you haven't already completed it.  Let me know if you have any questions.

## 2020-10-23 ENCOUNTER — Other Ambulatory Visit: Payer: Self-pay | Admitting: Nurse Practitioner

## 2020-10-24 NOTE — Telephone Encounter (Signed)
Requested medications are due for refill today NO  Requested medications are on the active medication list NO  Last refill 09/27/20  Last visit 10/16/20  Future visit scheduled 10/27/20  Notes to clinic Dose inconsistent with current med list, please assess.

## 2020-10-27 ENCOUNTER — Telehealth (INDEPENDENT_AMBULATORY_CARE_PROVIDER_SITE_OTHER): Payer: 59 | Admitting: Nurse Practitioner

## 2020-10-27 DIAGNOSIS — E538 Deficiency of other specified B group vitamins: Secondary | ICD-10-CM | POA: Diagnosis not present

## 2020-10-27 DIAGNOSIS — R202 Paresthesia of skin: Secondary | ICD-10-CM

## 2020-10-27 DIAGNOSIS — F1020 Alcohol dependence, uncomplicated: Secondary | ICD-10-CM | POA: Diagnosis not present

## 2020-10-27 DIAGNOSIS — E519 Thiamine deficiency, unspecified: Secondary | ICD-10-CM

## 2020-10-27 DIAGNOSIS — R200 Anesthesia of skin: Secondary | ICD-10-CM | POA: Diagnosis not present

## 2020-10-27 DIAGNOSIS — E039 Hypothyroidism, unspecified: Secondary | ICD-10-CM

## 2020-10-27 MED ORDER — GABAPENTIN 300 MG PO CAPS: 300.0000 mg | ORAL_CAPSULE | Freq: Three times a day (TID) | ORAL | 1 refills | Status: DC

## 2020-10-27 NOTE — Progress Notes (Signed)
There were no vitals taken for this visit.   Subjective:    Patient ID: Hailey Castaneda, female    DOB: [birth year], 35 y.o.   MRN: [id number]  HPI: Hailey Castaneda is a 35 y.o. female  Chief Complaint  Patient presents with   Peripheral Neuropathy   Patient states the numbness and tingling has gotten better.  She states that some days are better than others.  The Gabapentin 317m BID. Feels like the medication wears off before the next dose is due.  She has returned to work and is feeling really well.   Relevant past medical, surgical, family and social history reviewed and updated as indicated. Interim medical history since our last visit reviewed.  Allergies and medications reviewed and updated.  Review of Systems  Neurological:  Positive for numbness.   Per HPI unless specifically indicated above     Objective:    There were no vitals taken for this visit.  Wt Readings from Last 3 Encounters:  10/16/20 121 lb 6.4 oz (55.1 kg)  09/27/20 125 lb 3.2 oz (56.8 kg)  09/11/20 124 lb (56.2 kg)    Physical Exam Vitals and nursing note reviewed.  HENT:     Head: Normocephalic.     Right Ear: Hearing normal.     Left Ear: Hearing normal.     Nose: Nose normal.  Eyes:     Pupils: Pupils are equal, round, and reactive to light.  Pulmonary:     Effort: Pulmonary effort is normal. No respiratory distress.  Neurological:     Mental Status: She is alert.  Psychiatric:        Mood and Affect: Mood normal.        Behavior: Behavior normal.        Thought Content: Thought content normal.        Judgment: Judgment normal.    Results for orders placed or performed in visit on 10/16/20  Urine Culture   Specimen: Urine   UC  Result Value Ref Range   Urine Culture, Routine Final report    Organism ID, Bacteria No growth   Microscopic Examination   Urine  Result Value Ref Range   WBC, UA None seen 0 - 5 /hpf   RBC None seen 0 - 2 /hpf   Epithelial Cells (non renal) 0-10 0 - 10  /hpf   Casts Present (A) None seen /lpf   Cast Type Hyaline casts N/A   Mucus, UA Present (A) Not Estab.   Bacteria, UA Few (A) None seen/Few  Urinalysis, Routine w reflex microscopic  Result Value Ref Range   Specific Gravity, UA >1.030 (H) 1.005 - 1.030   pH, UA 5.5 5.0 - 7.5   Color, UA Yellow Yellow   Appearance Ur Cloudy (A) Clear   Leukocytes,UA Negative Negative   Protein,UA 1+ (A) Negative/Trace   Glucose, UA Negative Negative   Ketones, UA Trace (A) Negative   RBC, UA Negative Negative   Bilirubin, UA Negative Negative   Urobilinogen, Ur 0.2 0.2 - 1.0 mg/dL   Nitrite, UA Negative Negative   Microscopic Examination See below:       Assessment & Plan:   Problem List Items Addressed This Visit       Other   Alcohol use disorder, moderate, dependence ([place]    Improved.  Patient continues to abstain from alcohol.  Has seen benefits with her home life as well as how she is feeling.  Praised  for her hard work.  Continue to abstain from alcohol.      Relevant Orders   Comp Met (CMET)   Vitamin B1   Folate   Vitamin B12   Numbness and tingling    Chronic. Ongoing.  Symptoms have improved with Gabapentin. Will increase to Gabapentin 328m TID to help with symptoms.  Follow up in 3 months.      B12 deficiency    Chronic. Continue with supplement.  Labs ordered today. Will make recommendations based on lab results.       Relevant Orders   Comp Met (CMET)   Vitamin B1   Folate   Vitamin B12   Other Visit Diagnoses     Hypothyroidism, unspecified type       Labs ordered today. Will make recommendations based on lab results.    Relevant Orders   TSH   T4, free   Folate deficiency       Chronic. Continue with supplement.  Labs ordered today. Will make recommendations based on lab results.    Relevant Orders   Comp Met (CMET)   Vitamin B1   Folate   Vitamin B12   Thiamin deficiency       Chronic. Continue with supplement.  Labs ordered today. Will  make recommendations based on lab results.    Relevant Orders   Comp Met (CMET)   Vitamin B1   Folate   Vitamin B12        Follow up plan: Return in about 3 months (around 01/27/2021) for Neuropathy.   This visit was completed via MyChart due to the restrictions of the COVID-19 pandemic. All issues as above were discussed and addressed. Physical exam was done as above through visual confirmation on MyChart. If it was felt that the patient should be evaluated in the office, they were directed there. The patient verbally consented to this visit. Location of the patient: Home Location of the provider: Office Those involved with this call:  Provider: KJon Billings NP CMA: SElizabeth Palau CMA Front Desk/Registration: CMyrlene BrokerThis encounter was conducted via video.  I spent 20 dedicated to the care of this patient on the date of this encounter to include previsit review of 30, face to face time with the patient, and post visit ordering of testing.

## 2020-10-30 ENCOUNTER — Encounter: Payer: Self-pay | Admitting: Nurse Practitioner

## 2020-10-30 NOTE — Progress Notes (Signed)
Lmom asking pt to call back to schedule follow up appt. Lab appt is already scheduled.

## 2020-10-30 NOTE — Assessment & Plan Note (Signed)
Chronic. Continue with supplement.  Labs ordered today. Will make recommendations based on lab results.

## 2020-10-30 NOTE — Progress Notes (Signed)
Lmom asking pt to call back to schedule follow up appt. 

## 2020-10-30 NOTE — Assessment & Plan Note (Signed)
Improved.  Patient continues to abstain from alcohol.  Has seen benefits with her home life as well as how she is feeling.  Praised for her hard work.  Continue to abstain from alcohol.

## 2020-10-30 NOTE — Assessment & Plan Note (Signed)
Chronic. Ongoing.  Symptoms have improved with Gabapentin. Will increase to Gabapentin 300mg  TID to help with symptoms.  Follow up in 3 months.

## 2020-10-31 NOTE — Progress Notes (Signed)
Lmom asking pt to call back to schedule 3 month follow up. 

## 2020-11-21 ENCOUNTER — Other Ambulatory Visit (HOSPITAL_COMMUNITY): Payer: Self-pay | Admitting: Gastroenterology

## 2020-11-21 ENCOUNTER — Other Ambulatory Visit: Payer: Self-pay | Admitting: Gastroenterology

## 2020-11-21 DIAGNOSIS — R748 Abnormal levels of other serum enzymes: Secondary | ICD-10-CM

## 2020-11-28 ENCOUNTER — Ambulatory Visit: Payer: 59

## 2020-11-29 ENCOUNTER — Ambulatory Visit: Payer: 59

## 2020-12-08 ENCOUNTER — Other Ambulatory Visit: Payer: 59

## 2020-12-12 ENCOUNTER — Ambulatory Visit (INDEPENDENT_AMBULATORY_CARE_PROVIDER_SITE_OTHER): Payer: 59 | Admitting: Nurse Practitioner

## 2020-12-12 ENCOUNTER — Other Ambulatory Visit: Payer: Self-pay

## 2020-12-12 ENCOUNTER — Encounter: Payer: Self-pay | Admitting: Nurse Practitioner

## 2020-12-12 ENCOUNTER — Ambulatory Visit: Payer: 59 | Admitting: Nurse Practitioner

## 2020-12-12 VITALS — BP 112/80 | HR 92 | Temp 98.0°F

## 2020-12-12 DIAGNOSIS — J029 Acute pharyngitis, unspecified: Secondary | ICD-10-CM | POA: Diagnosis not present

## 2020-12-12 DIAGNOSIS — J069 Acute upper respiratory infection, unspecified: Secondary | ICD-10-CM

## 2020-12-12 MED ORDER — BENZONATATE 200 MG PO CAPS: 200.0000 mg | ORAL_CAPSULE | Freq: Three times a day (TID) | ORAL | 0 refills | Status: DC | PRN

## 2020-12-12 MED ORDER — DOXYCYCLINE HYCLATE 100 MG PO TABS: 100.0000 mg | ORAL_TABLET | Freq: Two times a day (BID) | ORAL | 0 refills | Status: DC

## 2020-12-12 NOTE — Progress Notes (Signed)
Acute Office Visit  Subjective:    Patient ID: Hailey Castaneda, female    DOB: July 30, 1985, 35 y.o.   MRN: [id number]  Chief Complaint  Patient presents with   Sore Throat    Pt states she has had a sore throat and cough for the past 3 days      HPI Patient is in today for sore throat and cough for 3 days. She was sick 2 weeks ago with a fever, cough and congestion, got slightly better, and then worsened again 3 days ago.    UPPER RESPIRATORY TRACT INFECTION  Worst symptom: sore throat Fever: no Cough: yes Shortness of breath: no Wheezing: no Chest pain: no Chest tightness: yes Chest congestion: no Nasal congestion: yes Runny nose: yes Post nasal drip: yes Sneezing: yes Sore throat: yes Swollen glands: no Sinus pressure: no Headache: no Face pain: no Toothache: no Ear pain: yes "right Ear pressure: no  Eyes red/itching:no Eye drainage/crusting: no  Vomiting: no Rash: no Fatigue: no Sick contacts: yes- son last week Strep contacts: no  Context: worse Recurrent sinusitis: no Relief with OTC cold/cough medications:  n/a   Treatments attempted: none    Past Medical History:  Diagnosis Date   Anxiety    Bipolar 1 disorder (HCC)    Sinus disorder     Past Surgical History:  Procedure Laterality Date   lymph node removal     chain of 9, beign     Family History  Problem Relation Age of Onset   Fibromyalgia Mother    Heart disease Mother    Hypertension Mother    COPD Mother    Heart attack Mother    Cancer Maternal Grandmother        breast and ovarian   Heart disease Maternal Grandmother    Hypertension Maternal Grandmother    Osteopenia Paternal Grandmother     Social History   Socioeconomic History   Marital status: Married    Spouse name: Not on file   Number of children: Not on file   Years of education: Not on file   Highest education level: Not on file  Occupational History   Not on file  Tobacco Use   Smoking status: Former     Types: Cigarettes    Quit date: 2011    Years since quitting: 11.8   Smokeless tobacco: Never  Vaping Use   Vaping Use: Never used  Substance and Sexual Activity   Alcohol use: Yes    Comment: social   Drug use: Never   Sexual activity: Yes    Birth control/protection: None  Other Topics Concern   Not on file  Social History Narrative   Not on file   Social Determinants of Health   Financial Resource Strain: Not on file  Food Insecurity: Not on file  Transportation Needs: Not on file  Physical Activity: Not on file  Stress: Not on file  Social Connections: Not on file  Intimate Partner Violence: Not on file    Outpatient Medications Prior to Visit  Medication Sig Dispense Refill   cetirizine (ZYRTEC) 10 MG tablet Take 1 tablet (10 mg total) by mouth daily as needed for allergies. 90 tablet 1   cholecalciferol (VITAMIN D3) 25 MCG (1000 UNIT) tablet Take 1,000 Units by mouth daily.     folic acid (FOLVITE) 1 MG tablet Take 1 tablet (1 mg total) by mouth daily. 90 tablet 1   gabapentin (NEURONTIN) 300 MG capsule Take 1 capsule (  300 mg total) by mouth 3 (three) times daily. 270 capsule 1   Ixekizumab 80 MG/ML SOSY Inject 80 mg into the skin every 30 (thirty) days.     lamoTRIgine (LAMICTAL) 150 MG tablet Take 1.5 tablets (225 mg total) by mouth daily. 135 tablet 1   levothyroxine (SYNTHROID) 25 MCG tablet Take 1 tablet (25 mcg total) by mouth daily. 90 tablet 1   Multiple Vitamin (MULTIVITAMIN WITH MINERALS) TABS tablet Take 1 tablet by mouth daily. 30 tablet 0   thiamine 100 MG tablet Take 1 tablet (100 mg total) by mouth daily. 30 tablet 0   No facility-administered medications prior to visit.    Allergies  Allergen Reactions   Azithromycin Other (See Comments)   Penicillins Other (See Comments)    Review of Systems  Constitutional:  Positive for fatigue. Negative for fever.  HENT:  Positive for congestion, postnasal drip, rhinorrhea, sneezing and sore throat.  Negative for ear pain and sinus pressure.   Eyes: Negative.   Respiratory:  Positive for cough. Negative for shortness of breath.   Cardiovascular: Negative.   Gastrointestinal: Negative.   Endocrine: Negative.   Genitourinary: Negative.   Musculoskeletal:  Positive for myalgias.  Skin: Negative.   Neurological: Negative.   Psychiatric/Behavioral: Negative.        Objective:    Physical Exam Vitals and nursing note reviewed.  Constitutional:      General: She is not in acute distress.    Appearance: Normal appearance.  HENT:     Head: Normocephalic.     Right Ear: Tympanic membrane, ear canal and external ear normal.     Left Ear: Tympanic membrane, ear canal and external ear normal.  Eyes:     Conjunctiva/sclera: Conjunctivae normal.  Cardiovascular:     Rate and Rhythm: Normal rate and regular rhythm.     Pulses: Normal pulses.     Heart sounds: Normal heart sounds.  Pulmonary:     Effort: Pulmonary effort is normal.     Breath sounds: Normal breath sounds.  Musculoskeletal:     Cervical back: Normal range of motion and neck supple. No tenderness.  Lymphadenopathy:     Cervical: No cervical adenopathy.  Skin:    General: Skin is warm.  Neurological:     General: No focal deficit present.     Mental Status: She is alert and oriented to person, place, and time.  Psychiatric:        Mood and Affect: Mood normal.        Behavior: Behavior normal.        Thought Content: Thought content normal.        Judgment: Judgment normal.    BP 112/80   Pulse 92   Temp 98 F (36.7 C) (Oral)   SpO2 99%  Wt Readings from Last 3 Encounters:  10/16/20 121 lb 6.4 oz (55.1 kg)  09/27/20 125 lb 3.2 oz (56.8 kg)  09/11/20 124 lb (56.2 kg)    Health Maintenance Due  Topic Date Due   COVID-19 Vaccine (1) Never done   Pneumococcal Vaccine 51-62 Years old (1 - PCV) Never done   TETANUS/TDAP  Never done   INFLUENZA VACCINE  Never done    There are no preventive care reminders  to display for this patient.   Lab Results  Component Value Date   TSH 7.170 (H) 10/06/2020   Lab Results  Component Value Date   WBC 6.9 09/27/2020   HGB 11.0 (L) 09/27/2020  HCT 33.6 (L) 09/27/2020   MCV 102 (H) 09/27/2020   PLT 257 09/27/2020   Lab Results  Component Value Date   NA 141 09/27/2020   K 4.4 09/27/2020   CO2 26 09/27/2020   GLUCOSE 94 09/27/2020   BUN 8 09/27/2020   CREATININE 0.71 09/27/2020   BILITOT 0.3 09/27/2020   ALKPHOS 151 (H) 09/27/2020   AST 72 (H) 09/27/2020   ALT 24 09/27/2020   PROT 6.9 09/27/2020   ALBUMIN 3.4 (L) 09/27/2020   CALCIUM 9.3 09/27/2020   ANIONGAP 5 09/13/2020   EGFR 114 09/27/2020   Lab Results  Component Value Date   CHOL 257 (H) 09/27/2020   Lab Results  Component Value Date   HDL 50 09/27/2020   Lab Results  Component Value Date   LDLCALC 159 (H) 09/27/2020   Lab Results  Component Value Date   TRIG 261 (H) 09/27/2020   Lab Results  Component Value Date   CHOLHDL 5.1 (H) 09/27/2020   No results found for: HGBA1C     Assessment & Plan:   Problem List Items Addressed This Visit   None Visit Diagnoses     Sore throat    -  Primary   Strep and flu test negative. [id number] pending   Relevant Orders   Rapid Strep screen([facility])   Veritor Flu A/B Waived   Novel Coronavirus, NAA ([facility])   Upper respiratory tract infection, unspecified type       Symptoms 2 weeks ago, then worsened, will treat with doxcycline and tessalon prn. Warm salt water gargle for sore throat. Fluids, rest. F/U if not improving        Meds ordered this encounter  Medications   doxycycline (VIBRA-TABS) 100 MG tablet    Sig: Take 1 tablet (100 mg total) by mouth 2 (two) times daily.    Dispense:  14 tablet    Refill:  0   benzonatate (TESSALON) 200 MG capsule    Sig: Take 1 capsule (200 mg total) by mouth 3 (three) times daily as needed for cough.    Dispense:  20 capsule    Refill:  0      Charyl Dancer,  NP

## 2020-12-12 NOTE — Patient Instructions (Addendum)
Gargle with warm salt water Ibuprofen as needed for sore throat Tessalon as needed for cough Doxcycyline twice a day for 7 days  We will let you know your covid-19 test results tomorrow

## 2020-12-13 LAB — SARS-COV-2, NAA 2 DAY TAT

## 2020-12-13 LAB — NOVEL CORONAVIRUS, NAA: SARS-CoV-2, NAA: NOT DETECTED

## 2020-12-16 LAB — CULTURE, GROUP A STREP: Strep A Culture: NEGATIVE

## 2020-12-16 LAB — VERITOR FLU A/B WAIVED
Influenza A: NEGATIVE
Influenza B: NEGATIVE

## 2020-12-16 LAB — RAPID STREP SCREEN (MED CTR MEBANE ONLY): Strep Gp A Ag, IA W/Reflex: NEGATIVE

## 2021-01-03 ENCOUNTER — Ambulatory Visit: Payer: 59 | Admitting: Nurse Practitioner

## 2021-01-21 ENCOUNTER — Other Ambulatory Visit: Payer: Self-pay | Admitting: Nurse Practitioner

## 2021-01-24 NOTE — Telephone Encounter (Signed)
Refilled 10/27/2020 #270 with 1 refill. Requested Prescriptions  Pending Prescriptions Disp Refills   gabapentin (NEURONTIN) 300 MG capsule [Pharmacy Med Name: GABAPENTIN 300MG  CAPSULES] 180 capsule     Sig: TAKE 1 CAPSULE(300 MG) BY MOUTH TWICE DAILY     Neurology: Anticonvulsants - gabapentin Passed - 01/21/2021  8:09 AM      Passed - Valid encounter within last 12 months    Recent Outpatient Visits          1 month ago Sore throat   [place], NP   2 months ago Alcohol use disorder, moderate, dependence ([place])   [place], Santiago Glad, NP   3 months ago Acute cystitis without hematuria   [place], NP   3 months ago Annual physical exam   [place], NP   7 months ago Bipolar 1 disorder [facility])   [facility] Jon Billings, NP

## 2021-03-24 DIAGNOSIS — R9431 Abnormal electrocardiogram [ECG] [EKG]: Secondary | ICD-10-CM | POA: Diagnosis not present

## 2021-03-24 DIAGNOSIS — R410 Disorientation, unspecified: Secondary | ICD-10-CM | POA: Diagnosis not present

## 2021-03-24 DIAGNOSIS — F319 Bipolar disorder, unspecified: Secondary | ICD-10-CM | POA: Diagnosis not present

## 2021-03-24 DIAGNOSIS — R531 Weakness: Secondary | ICD-10-CM | POA: Diagnosis not present

## 2021-03-24 DIAGNOSIS — F17210 Nicotine dependence, cigarettes, uncomplicated: Secondary | ICD-10-CM | POA: Diagnosis not present

## 2021-03-24 DIAGNOSIS — R4182 Altered mental status, unspecified: Secondary | ICD-10-CM | POA: Diagnosis not present

## 2021-03-24 DIAGNOSIS — R319 Hematuria, unspecified: Secondary | ICD-10-CM | POA: Diagnosis not present

## 2021-03-24 DIAGNOSIS — F1020 Alcohol dependence, uncomplicated: Secondary | ICD-10-CM | POA: Diagnosis not present

## 2021-03-24 DIAGNOSIS — Z20822 Contact with and (suspected) exposure to covid-19: Secondary | ICD-10-CM | POA: Diagnosis not present

## 2021-03-24 DIAGNOSIS — F10229 Alcohol dependence with intoxication, unspecified: Secondary | ICD-10-CM | POA: Diagnosis not present

## 2021-03-24 DIAGNOSIS — Z880 Allergy status to penicillin: Secondary | ICD-10-CM | POA: Diagnosis not present

## 2021-03-24 DIAGNOSIS — F1010 Alcohol abuse, uncomplicated: Secondary | ICD-10-CM | POA: Diagnosis not present

## 2021-03-24 DIAGNOSIS — E512 Wernicke's encephalopathy: Secondary | ICD-10-CM | POA: Diagnosis not present

## 2021-03-24 DIAGNOSIS — H532 Diplopia: Secondary | ICD-10-CM | POA: Diagnosis not present

## 2021-03-24 DIAGNOSIS — I10 Essential (primary) hypertension: Secondary | ICD-10-CM | POA: Diagnosis not present

## 2021-04-19 ENCOUNTER — Other Ambulatory Visit: Payer: Self-pay | Admitting: Nurse Practitioner

## 2021-04-20 NOTE — Telephone Encounter (Signed)
Requested Prescriptions  ?Pending Prescriptions Disp Refills  ?? folic acid (FOLVITE) 1 MG tablet [Pharmacy Med Name: FOLIC ACID 1MG  TABLETS] 90 tablet 1  ?  Sig: TAKE 1 TABLET(1 MG) BY MOUTH DAILY  ?  ? Endocrinology:  Vitamins Passed - 04/19/2021 11:30 AM  ?  ?  Passed - Valid encounter within last 12 months  ?  Recent Outpatient Visits   ?      ? 4 months ago Sore throat  ? [facility] McElwee, Lauren A, NP  ? 5 months ago Alcohol use disorder, moderate, dependence (HCC)  ? [facility], NP  ? 6 months ago Acute cystitis without hematuria  ? [facility], NP  ? 6 months ago Annual physical exam  ? [facility], NP  ? 10 months ago Bipolar 1 disorder [facility])  ? [facility], NP  ?  ?  ? ?  ?  ?  ?? lamoTRIgine (LAMICTAL) 150 MG tablet [Pharmacy Med Name: LAMOTRIGINE 150MG  TABLETS] 135 tablet 1  ?  Sig: TAKE 1 AND 1/2 TABLETS(225 MG) BY MOUTH DAILY  ?  ? Neurology:  Anticonvulsants - lamotrigine Failed - 04/19/2021 11:30 AM  ?  ?  Failed - AST in normal range and within 360 days  ?  AST  ?Date Value Ref Range Status  ?09/27/2020 72 (H) 0 - 40 IU/L Final  ?   ?  ?  Passed - Cr in normal range and within 360 days  ?  Creatinine, Ser  ?Date Value Ref Range Status  ?09/27/2020 0.71 0.57 - 1.00 mg/dL Final  ?   ?  ?  Passed - ALT in normal range and within 360 days  ?  ALT  ?Date Value Ref Range Status  ?09/27/2020 24 0 - 32 IU/L Final  ?   ?  ?  Passed - Completed PHQ-2 or PHQ-9 in the last 360 days  ?  ?  Passed - Valid encounter within last 12 months  ?  Recent Outpatient Visits   ?      ? 4 months ago Sore throat  ? [facility] McElwee, Lauren A, NP  ? 5 months ago Alcohol use disorder, moderate, dependence (HCC)  ? [facility] 11/27/2020, NP  ? 6 months ago Acute cystitis without hematuria  ? [facility] Larae Grooms, NP  ? 6 months ago  Annual physical exam  ? [facility] Larae Grooms, NP  ? 10 months ago Bipolar 1 disorder [facility])  ? [facility], NP  ?  ?  ? ?  ?  ?  ? ?

## 2021-04-20 NOTE — Telephone Encounter (Signed)
Requested medication (s) are due for refill today - yes ? ?Requested medication (s) are on the active medication list -yes ? ?Future visit scheduled -no ? ?Last refill: 10/17/20 #135 1RF ? ?Notes to clinic: Request RF: fail lab protocol- no follow up lab as requested- sent for provider review  ? ?Requested Prescriptions  ?Pending Prescriptions Disp Refills  ? lamoTRIgine (LAMICTAL) 150 MG tablet [Pharmacy Med Name: LAMOTRIGINE 150MG  TABLETS] 135 tablet 1  ?  Sig: TAKE 1 AND 1/2 TABLETS(225 MG) BY MOUTH DAILY  ?  ? Neurology:  Anticonvulsants - lamotrigine Failed - 04/19/2021 11:30 AM  ?  ?  Failed - AST in normal range and within 360 days  ?  AST  ?Date Value Ref Range Status  ?09/27/2020 72 (H) 0 - 40 IU/L Final  ?  ?  ?  ?  Passed - Cr in normal range and within 360 days  ?  Creatinine, Ser  ?Date Value Ref Range Status  ?09/27/2020 0.71 0.57 - 1.00 mg/dL Final  ?  ?  ?  ?  Passed - ALT in normal range and within 360 days  ?  ALT  ?Date Value Ref Range Status  ?09/27/2020 24 0 - 32 IU/L Final  ?  ?  ?  ?  Passed - Completed PHQ-2 or PHQ-9 in the last 360 days  ?  ?  Passed - Valid encounter within last 12 months  ?  Recent Outpatient Visits   ? ?      ? 4 months ago Sore throat  ? [facility] McElwee, Lauren A, NP  ? 5 months ago Alcohol use disorder, moderate, dependence (HCC)  ? [facility], NP  ? 6 months ago Acute cystitis without hematuria  ? [facility], NP  ? 6 months ago Annual physical exam  ? [facility], NP  ? 10 months ago Bipolar 1 disorder [facility])  ? [facility], NP  ? ?  ?  ? ?  ?  ?  ?Signed Prescriptions Disp Refills  ? folic acid (FOLVITE) 1 MG tablet 90 tablet 1  ?  Sig: TAKE 1 TABLET(1 MG) BY MOUTH DAILY  ?  ? Endocrinology:  Vitamins Passed - 04/19/2021 11:30 AM  ?  ?  Passed - Valid encounter within last 12 months  ?  Recent Outpatient Visits   ? ?      ? 4  months ago Sore throat  ? [facility] McElwee, Lauren A, NP  ? 5 months ago Alcohol use disorder, moderate, dependence (HCC)  ? [facility], NP  ? 6 months ago Acute cystitis without hematuria  ? [facility], NP  ? 6 months ago Annual physical exam  ? [facility], NP  ? 10 months ago Bipolar 1 disorder [facility])  ? [facility], NP  ? ?  ?  ? ?  ?  ?  ? ? ? ?Requested Prescriptions  ?Pending Prescriptions Disp Refills  ? lamoTRIgine (LAMICTAL) 150 MG tablet [Pharmacy Med Name: LAMOTRIGINE 150MG  TABLETS] 135 tablet 1  ?  Sig: TAKE 1 AND 1/2 TABLETS(225 MG) BY MOUTH DAILY  ?  ? Neurology:  Anticonvulsants - lamotrigine Failed - 04/19/2021 11:30 AM  ?  ?  Failed - AST in normal range and within 360 days  ?  AST  ?Date Value Ref Range Status  ?  09/27/2020 72 (H) 0 - 40 IU/L Final  ?  ?  ?  ?  Passed - Cr in normal range and within 360 days  ?  Creatinine, Ser  ?Date Value Ref Range Status  ?09/27/2020 0.71 0.57 - 1.00 mg/dL Final  ?  ?  ?  ?  Passed - ALT in normal range and within 360 days  ?  ALT  ?Date Value Ref Range Status  ?09/27/2020 24 0 - 32 IU/L Final  ?  ?  ?  ?  Passed - Completed PHQ-2 or PHQ-9 in the last 360 days  ?  ?  Passed - Valid encounter within last 12 months  ?  Recent Outpatient Visits   ? ?      ? 4 months ago Sore throat  ? [facility] McElwee, Lauren A, NP  ? 5 months ago Alcohol use disorder, moderate, dependence (HCC)  ? [facility] Larae Grooms, NP  ? 6 months ago Acute cystitis without hematuria  ? [facility] Larae Grooms, NP  ? 6 months ago Annual physical exam  ? [facility] Larae Grooms, NP  ? 10 months ago Bipolar 1 disorder [facility])  ? [facility] Larae Grooms, NP  ? ?  ?  ? ?  ?  ?  ?Signed Prescriptions Disp Refills  ? folic acid (FOLVITE) 1 MG tablet 90 tablet 1  ?  Sig:  TAKE 1 TABLET(1 MG) BY MOUTH DAILY  ?  ? Endocrinology:  Vitamins Passed - 04/19/2021 11:30 AM  ?  ?  Passed - Valid encounter within last 12 months  ?  Recent Outpatient Visits   ? ?      ? 4 months ago Sore throat  ? [facility] McElwee, Lauren A, NP  ? 5 months ago Alcohol use disorder, moderate, dependence (HCC)  ? [facility] Larae Grooms, NP  ? 6 months ago Acute cystitis without hematuria  ? [facility] Larae Grooms, NP  ? 6 months ago Annual physical exam  ? [facility] Larae Grooms, NP  ? 10 months ago Bipolar 1 disorder [facility])  ? [facility] Larae Grooms, NP  ? ?  ?  ? ?  ?  ?  ? ? ? ?

## 2021-04-20 NOTE — Telephone Encounter (Signed)
Patient is overdue for appointment. Please call to schedule follow up.  ?

## 2021-04-20 NOTE — Telephone Encounter (Signed)
Called patient to schedule appointment. Unable to leave a message because voicemail is full. ?

## 2021-04-23 NOTE — Telephone Encounter (Signed)
2nd attmept- Unable to leave vm ?

## 2021-04-24 ENCOUNTER — Encounter: Payer: Self-pay | Admitting: Nurse Practitioner

## 2021-04-24 NOTE — Telephone Encounter (Signed)
3rd attempt to reach pt.  Letter sent

## 2021-10-17 DIAGNOSIS — L578 Other skin changes due to chronic exposure to nonionizing radiation: Secondary | ICD-10-CM | POA: Diagnosis not present

## 2021-10-17 DIAGNOSIS — L400 Psoriasis vulgaris: Secondary | ICD-10-CM | POA: Diagnosis not present

## 2021-10-17 DIAGNOSIS — Z79899 Other long term (current) drug therapy: Secondary | ICD-10-CM | POA: Diagnosis not present

## 2021-10-18 DIAGNOSIS — Z3202 Encounter for pregnancy test, result negative: Secondary | ICD-10-CM | POA: Diagnosis not present

## 2021-10-18 DIAGNOSIS — F319 Bipolar disorder, unspecified: Secondary | ICD-10-CM | POA: Diagnosis not present

## 2021-10-18 DIAGNOSIS — F10120 Alcohol abuse with intoxication, uncomplicated: Secondary | ICD-10-CM | POA: Diagnosis not present

## 2021-10-18 DIAGNOSIS — F17210 Nicotine dependence, cigarettes, uncomplicated: Secondary | ICD-10-CM | POA: Diagnosis not present

## 2021-10-18 DIAGNOSIS — I10 Essential (primary) hypertension: Secondary | ICD-10-CM | POA: Diagnosis not present

## 2021-10-18 DIAGNOSIS — F10920 Alcohol use, unspecified with intoxication, uncomplicated: Secondary | ICD-10-CM | POA: Diagnosis not present

## 2021-10-18 DIAGNOSIS — Z880 Allergy status to penicillin: Secondary | ICD-10-CM | POA: Diagnosis not present

## 2021-10-18 DIAGNOSIS — Y908 Blood alcohol level of 240 mg/100 ml or more: Secondary | ICD-10-CM | POA: Diagnosis not present

## 2021-10-18 DIAGNOSIS — F1090 Alcohol use, unspecified, uncomplicated: Secondary | ICD-10-CM | POA: Diagnosis not present

## 2021-10-18 DIAGNOSIS — F10129 Alcohol abuse with intoxication, unspecified: Secondary | ICD-10-CM | POA: Diagnosis not present

## 2021-10-18 DIAGNOSIS — R45851 Suicidal ideations: Secondary | ICD-10-CM | POA: Diagnosis not present

## 2021-10-18 DIAGNOSIS — Z79890 Hormone replacement therapy (postmenopausal): Secondary | ICD-10-CM | POA: Diagnosis not present

## 2021-10-18 DIAGNOSIS — Z79899 Other long term (current) drug therapy: Secondary | ICD-10-CM | POA: Diagnosis not present

## 2021-10-19 DIAGNOSIS — F1090 Alcohol use, unspecified, uncomplicated: Secondary | ICD-10-CM | POA: Diagnosis not present

## 2021-10-19 DIAGNOSIS — F10920 Alcohol use, unspecified with intoxication, uncomplicated: Secondary | ICD-10-CM | POA: Diagnosis not present

## 2021-10-19 DIAGNOSIS — F319 Bipolar disorder, unspecified: Secondary | ICD-10-CM | POA: Diagnosis not present

## 2021-10-22 ENCOUNTER — Other Ambulatory Visit: Payer: Self-pay | Admitting: Nurse Practitioner

## 2021-10-23 NOTE — Telephone Encounter (Signed)
Requested Prescriptions  Pending Prescriptions Disp Refills  . cetirizine (ZYRTEC) 10 MG tablet [Pharmacy Med Name: CETIRIZINE 10MG  TABLETS] 90 tablet 1    Sig: TAKE 1 TABLET(10 MG) BY MOUTH DAILY AS NEEDED FOR ALLERGIES     Ear, Nose, and Throat:  Antihistamines 2 Failed - 10/22/2021  8:30 PM      Failed - Cr in normal range and within 360 days    Creatinine, Ser  Date Value Ref Range Status  09/27/2020 0.71 0.57 - 1.00 mg/dL Final         Passed - Valid encounter within last 12 months    Recent Outpatient Visits          10 months ago Sore throat   [place], Lauren A, NP   12 months ago Alcohol use disorder, moderate, dependence ([place])   [place] Jon Billings, NP   1 year ago Acute cystitis without hematuria   [facility] Jon Billings, NP   1 year ago Annual physical exam   [facility] Jon Billings, NP   1 year ago Bipolar 1 disorder [facility])   [facility] Jon Billings, NP

## 2022-02-06 ENCOUNTER — Ambulatory Visit
Admission: EM | Admit: 2022-02-06 | Discharge: 2022-02-06 | Disposition: A | Discharge status: Home / Self Care | Payer: BC Managed Care – PPO | Attending: Nurse Practitioner | Admitting: Nurse Practitioner

## 2022-02-06 DIAGNOSIS — R300 Dysuria: Secondary | ICD-10-CM

## 2022-02-06 DIAGNOSIS — N3001 Acute cystitis with hematuria: Secondary | ICD-10-CM | POA: Insufficient documentation

## 2022-02-06 LAB — URINALYSIS, ROUTINE W REFLEX MICROSCOPIC
Glucose, UA: NEGATIVE mg/dL
Nitrite: NEGATIVE
Protein, ur: 100 mg/dL — AB
Specific Gravity, Urine: 1.02 (ref 1.005–1.030)
pH: 8.5 — ABNORMAL HIGH (ref 5.0–8.0)

## 2022-02-06 LAB — URINALYSIS, MICROSCOPIC (REFLEX): WBC, UA: >50 WBC/hpf (ref 0–5)

## 2022-02-06 MED ORDER — PHENAZOPYRIDINE HCL 200 MG PO TABS: 200.0000 mg | ORAL_TABLET | Freq: Three times a day (TID) | ORAL | 0 refills | Status: DC | PRN

## 2022-02-06 MED ORDER — CEFDINIR 300 MG PO CAPS: 300.0000 mg | ORAL_CAPSULE | Freq: Two times a day (BID) | ORAL | 0 refills | Status: DC

## 2022-02-06 NOTE — ED Provider Notes (Signed)
[facility]    CSN: [id number] Arrival date & time: 02/06/22  1601      History   Chief Complaint Chief Complaint  Patient presents with   Urinary Tract Infection    HPI Hailey Castaneda is a 37 y.o. female who presents for evaluation of dysuria. Patient reports associated symptoms of urinary burning, urgency, and frequency for one day. Patient  reports a history of UTI's and a history of pyelonephritis. Patient denies fever, chills, nausea, vomiting, abdominal pain, flank pain, low back pain and other signs of systemic infection. No vaginal discharge or STD concern. Patient has taken nothing for symptoms. No other c/o today.    Urinary Tract Infection   Past Medical History:  Diagnosis Date   Anxiety    Bipolar 1 disorder (HCC)    Sinus disorder     Patient Active Problem List   Diagnosis Date Noted   Numbness and tingling 09/28/2020   B12 deficiency 09/28/2020   Sepsis secondary to UTI (HCC) 09/11/2020   Alcohol use disorder, moderate, dependence (HCC) 09/11/2020   Hepatic steatosis 09/11/2020   Hypokalemia 09/11/2020   Macrocytic anemia 09/11/2020   Alcohol abuse 09/22/2018   Psoriasis 09/22/2018   Bipolar 1 disorder (HCC) 04/17/2015   GAD (generalized anxiety disorder) 04/17/2015    Past Surgical History:  Procedure Laterality Date   lymph node removal     chain of 9, beign     OB History   No obstetric history on file.      Home Medications    Prior to Admission medications   Medication Sig Start Date End Date Taking? Authorizing Provider  benzonatate (TESSALON) 200 MG capsule Take 1 capsule (200 mg total) by mouth 3 (three) times daily as needed for cough. 12/12/20  Yes McElwee, Lauren A, NP  cefdinir (OMNICEF) 300 MG capsule Take 1 capsule (300 mg total) by mouth 2 (two) times daily for 7 days. 02/06/22 02/13/22 Yes Radford Pax, NP  cetirizine (ZYRTEC) 10 MG tablet TAKE 1 TABLET(10 MG) BY MOUTH DAILY AS NEEDED FOR ALLERGIES 10/23/21  Yes  Larae Grooms, NP  cholecalciferol (VITAMIN D3) 25 MCG (1000 UNIT) tablet Take 1,000 Units by mouth daily.   Yes [provider]  doxycycline (VIBRA-TABS) 100 MG tablet Take 1 tablet (100 mg total) by mouth 2 (two) times daily. 12/12/20  Yes McElwee, Lauren A, NP  folic acid (FOLVITE) 1 MG tablet TAKE 1 TABLET(1 MG) BY MOUTH DAILY 04/20/21  Yes Larae Grooms, NP  gabapentin (NEURONTIN) 300 MG capsule Take 1 capsule (300 mg total) by mouth 3 (three) times daily. 10/27/20  Yes Larae Grooms, NP  Ixekizumab 80 MG/ML SOSY Inject 80 mg into the skin every 30 (thirty) days.   Yes [provider]  lamoTRIgine (LAMICTAL) 150 MG tablet Take 1.5 tablets (225 mg total) by mouth daily. 10/17/20  Yes Larae Grooms, NP  levothyroxine (SYNTHROID) 25 MCG tablet Take 1 tablet (25 mcg total) by mouth daily. 10/09/20  Yes Larae Grooms, NP  Multiple Vitamin (MULTIVITAMIN WITH MINERALS) TABS tablet Take 1 tablet by mouth daily. 09/14/20  Yes Azucena Fallen, MD  phenazopyridine (PYRIDIUM) 200 MG tablet Take 1 tablet (200 mg total) by mouth 3 (three) times daily as needed for pain. 02/06/22  Yes Radford Pax, NP  thiamine 100 MG tablet Take 1 tablet (100 mg total) by mouth daily. 09/14/20  Yes Azucena Fallen, MD    Family History Family History  Problem Relation Age of Onset  Fibromyalgia Mother    Heart disease Mother    Hypertension Mother    COPD Mother    Heart attack Mother    Cancer Maternal Grandmother        breast and ovarian   Heart disease Maternal Grandmother    Hypertension Maternal Grandmother    Osteopenia Paternal Grandmother     Social History Social History   Tobacco Use   Smoking status: Former    Types: Cigarettes    Quit date: 2011    Years since quitting: 13.0   Smokeless tobacco: Never  Vaping Use   Vaping Use: Never used  Substance Use Topics   Alcohol use: Yes    Comment: social   Drug use: Never     Allergies    Azithromycin and Penicillins   Review of Systems Review of Systems  Genitourinary:  Positive for dysuria.     Physical Exam Triage Vital Signs ED Triage Vitals  Enc Vitals Group     BP 02/06/22 1612 (!) 143/99     Pulse Rate 02/06/22 1612 78     Resp 02/06/22 1612 16     Temp 02/06/22 1612 98.5 F (36.9 C)     Temp Source 02/06/22 1612 Oral     SpO2 02/06/22 1612 98 %     Weight 02/06/22 1611 125 lb (56.7 kg)     Height 02/06/22 1611 5\' 6"  (1.676 m)     Head Circumference --      Peak Flow --      Pain Score 02/06/22 1610 0     Pain Loc --      Pain Edu? --      Excl. in [place]? --    No data found.  Updated Vital Signs BP (!) 143/99 (BP Location: Left Arm)   Pulse 78   Temp 98.5 F (36.9 C) (Oral)   Resp 16   Ht 5\' 6"  (1.676 m)   Wt 125 lb (56.7 kg)   LMP 01/06/2022 (Approximate)   SpO2 98%   BMI 20.18 kg/m   Visual Acuity Right Eye Distance:   Left Eye Distance:   Bilateral Distance:    Right Eye Near:   Left Eye Near:    Bilateral Near:     Physical Exam Vitals and nursing note reviewed.  Constitutional:      Appearance: Normal appearance.  HENT:     Head: Normocephalic and atraumatic.  Eyes:     Pupils: Pupils are equal, round, and reactive to light.  Cardiovascular:     Rate and Rhythm: Normal rate.  Pulmonary:     Effort: Pulmonary effort is normal.  Abdominal:     Tenderness: There is no right CVA tenderness or left CVA tenderness.  Skin:    General: Skin is warm and dry.  Neurological:     General: No focal deficit present.     Mental Status: She is alert and oriented to person, place, and time.  Psychiatric:        Mood and Affect: Mood normal.        Behavior: Behavior normal.      UC Treatments / Results  Labs (all labs ordered are listed, but only abnormal results are displayed) Labs Reviewed  URINALYSIS, ROUTINE W REFLEX MICROSCOPIC - Abnormal; Notable for the following components:      Result Value   APPearance CLOUDY  (*)    pH 8.5 (*)    Hgb urine dipstick MODERATE (*)  Bilirubin Urine SMALL (*)    Ketones, ur TRACE (*)    Protein, ur 100 (*)    Leukocytes,Ua MODERATE (*)    All other components within normal limits  URINALYSIS, MICROSCOPIC (REFLEX) - Abnormal; Notable for the following components:   Bacteria, UA FEW (*)    All other components within normal limits  URINE CULTURE    EKG   Radiology No results found.  Procedures Procedures (including critical care time)  Medications Ordered in UC Medications - No data to display  Initial Impression / Assessment and Plan / UC Course  I have reviewed the triage vital signs and the nursing notes.  Pertinent labs & imaging results that were available during my care of the patient were reviewed by me and considered in my medical decision making (see chart for details).     Reviewed exam and symptoms with patient.  No red flags on exam Start cefdinir Urine culture Pyridium. Rest and fluids PCP follow-up 2 to 3 days for recheck ER precautions reviewed patient verbalized understanding Final Clinical Impressions(s) / UC Diagnoses   Final diagnoses:  Dysuria  Acute cystitis with hematuria     Discharge Instructions      Antibiotic as prescribed Rest and fluids Pyridium as needed for urinary pain.  Please do this medication make your urine orange Will contact you with results of urine culture if it requires a change in your treatment Follow-up with your PCP 2 to 3 days for recheck Please go to the ER for any worsening symptoms    ED Prescriptions     Medication Sig Dispense Auth. Provider   cefdinir (OMNICEF) 300 MG capsule Take 1 capsule (300 mg total) by mouth 2 (two) times daily for 7 days. 14 capsule Melynda Ripple, NP   phenazopyridine (PYRIDIUM) 200 MG tablet Take 1 tablet (200 mg total) by mouth 3 (three) times daily as needed for pain. 6 tablet Melynda Ripple, NP      PDMP not reviewed this encounter.   Melynda Ripple, NP 02/06/22 [phone]

## 2022-02-06 NOTE — ED Triage Notes (Signed)
Pt c/o urinary frequency,burning & hematuria x1 day.

## 2022-02-06 NOTE — Discharge Instructions (Addendum)
Antibiotic as prescribed Rest and fluids Pyridium as needed for urinary pain.  Please do this medication make your urine orange Will contact you with results of urine culture if it requires a change in your treatment Follow-up with your PCP 2 to 3 days for recheck Please go to the ER for any worsening symptoms

## 2022-02-07 LAB — URINE CULTURE: Culture: <10000 — AB

## 2022-02-11 ENCOUNTER — Ambulatory Visit (INDEPENDENT_AMBULATORY_CARE_PROVIDER_SITE_OTHER): Payer: BC Managed Care – PPO | Admitting: Nurse Practitioner

## 2022-02-11 ENCOUNTER — Other Ambulatory Visit: Payer: Self-pay | Admitting: Nurse Practitioner

## 2022-02-11 ENCOUNTER — Other Ambulatory Visit (HOSPITAL_COMMUNITY)
Admission: RE | Admit: 2022-02-11 | Discharge: 2022-02-11 | Disposition: A | Discharge status: Home / Self Care | Payer: BC Managed Care – PPO | Source: Ambulatory Visit | Attending: Nurse Practitioner | Admitting: Nurse Practitioner

## 2022-02-11 ENCOUNTER — Encounter: Payer: Self-pay | Admitting: Nurse Practitioner

## 2022-02-11 VITALS — BP 124/84 | HR 96 | Temp 99.0°F | Ht 65.5 in | Wt 130.6 lb

## 2022-02-11 DIAGNOSIS — Z0000 Encounter for general adult medical examination without abnormal findings: Secondary | ICD-10-CM | POA: Diagnosis not present

## 2022-02-11 DIAGNOSIS — F1020 Alcohol dependence, uncomplicated: Secondary | ICD-10-CM

## 2022-02-11 DIAGNOSIS — R300 Dysuria: Secondary | ICD-10-CM

## 2022-02-11 DIAGNOSIS — F319 Bipolar disorder, unspecified: Secondary | ICD-10-CM | POA: Diagnosis not present

## 2022-02-11 DIAGNOSIS — E538 Deficiency of other specified B group vitamins: Secondary | ICD-10-CM

## 2022-02-11 DIAGNOSIS — F411 Generalized anxiety disorder: Secondary | ICD-10-CM | POA: Diagnosis not present

## 2022-02-11 DIAGNOSIS — Z1159 Encounter for screening for other viral diseases: Secondary | ICD-10-CM

## 2022-02-11 DIAGNOSIS — K760 Fatty (change of) liver, not elsewhere classified: Secondary | ICD-10-CM | POA: Diagnosis not present

## 2022-02-11 DIAGNOSIS — Z23 Encounter for immunization: Secondary | ICD-10-CM

## 2022-02-11 DIAGNOSIS — E7800 Pure hypercholesterolemia, unspecified: Secondary | ICD-10-CM | POA: Diagnosis not present

## 2022-02-11 LAB — URINALYSIS, ROUTINE W REFLEX MICROSCOPIC: Specific Gravity, UA: 1.015 (ref 1.005–1.030)

## 2022-02-11 LAB — MICROSCOPIC EXAMINATION: RBC, Urine: NONE SEEN /hpf (ref 0–2)

## 2022-02-11 LAB — WET PREP FOR TRICH, YEAST, CLUE
Clue Cell Exam: POSITIVE — AB
Trichomonas Exam: NEGATIVE
Yeast Exam: NEGATIVE

## 2022-02-11 MED ORDER — NALTREXONE HCL 50 MG PO TABS: 25.0000 mg | ORAL_TABLET | Freq: Every day | ORAL | 0 refills | Status: DC

## 2022-02-11 MED ORDER — METRONIDAZOLE 500 MG PO TABS: 500.0000 mg | ORAL_TABLET | Freq: Two times a day (BID) | ORAL | 0 refills | Status: AC

## 2022-02-11 NOTE — Assessment & Plan Note (Addendum)
Chronic.  Has been using off and on over the last year.  Has been sober almost two weeks.  Referral placed for psychiatry to address underlying sleep issues, depression and anxiety to help in her sobriety.  Will start Naltrexone to help with cravings.  Side effects and benefits of medication discussed during visit today.  Start with 1/2 tab for 1 week then increase to 1 tab.  Labs ordered today.  Follow up in 1 month.  Call sooner if concerns arise.

## 2022-02-11 NOTE — Assessment & Plan Note (Signed)
Labs ordered at visit today.  Will make recommendations based on lab results.   

## 2022-02-11 NOTE — Progress Notes (Signed)
Result sent to patient via my chart.

## 2022-02-11 NOTE — Assessment & Plan Note (Signed)
Chronic. Weaned herself off Lamictal and feels like she is doing well.  However, she does have underlying depression and anxiety.  Has had trouble sleeping without the alcohol.  Referral placed for patient to see psychiatry.  Follow up in 1 month.   

## 2022-02-11 NOTE — Progress Notes (Signed)
BP 124/84   Pulse 96   Temp 99 F (37.2 C) (Oral)   Ht 5' 5.5" (1.664 m)   Wt 130 lb 9.6 oz (59.2 kg)   LMP 01/06/2022 (Approximate)   SpO2 98%   BMI 21.40 kg/m    Subjective:    Patient ID: Hailey Castaneda, female    DOB: November 20, 1985, 37 y.o.   MRN: [id number]  HPI: DENNIS KILLILEA is a 37 y.o. female presenting on 02/11/2022 for comprehensive medical examination. Current medical complaints include: uti  She currently lives with: Menopausal Symptoms: no  Patient states she was seen at the walk in for UTI.  Her culture didn't show any bacteria.   URINARY SYMPTOMS Dysuria: yes Urinary frequency: yes Urgency: yes Small volume voids: yes Symptom severity: no Urinary incontinence: no Foul odor: no Hematuria:  not since Thursday Abdominal pain:  some Back pain: no Suprapubic pain/pressure: yes Flank pain: no Fever:  no Vomiting: no Relief with cranberry juice:  none Relief with pyridium: yes Status: better Previous urinary tract infection: no Recurrent urinary tract infection: no Sexual activity: currently sexually active History of sexually transmitted disease: no Treatments attempted: antibiotics and pyridium    MOOD Patient has been on and off sober.  Tomorrow is two weeks sober.  She does feel better with her vitamins but hasn't been consistent with them.  She does have anxiety and cravings which cause her to drink.  She has been having trouble sleeping.  Has trouble sleeping at night due to not being able to shut her brain off. She weaned herself off the Lamictal and has been doing well.   Depression Screen done today and results listed below:     02/11/2022    9:30 AM 06/21/2020    1:49 PM  Depression screen PHQ 2/9  Decreased Interest 1 0  Down, Depressed, Hopeless 1 0  PHQ - 2 Score 2 0  Altered sleeping 3   Tired, decreased energy 0   Change in appetite 0   Feeling bad or failure about yourself  1   Trouble concentrating 0   Moving slowly or  fidgety/restless 0   Suicidal thoughts 0   PHQ-9 Score 6   Difficult doing work/chores Somewhat difficult     The patient does not have a history of falls. I did complete a risk assessment for falls. A plan of care for falls was documented.   Past Medical History:  Past Medical History:  Diagnosis Date   Anxiety    Bipolar 1 disorder (HCC)    Sinus disorder     Surgical History:  Past Surgical History:  Procedure Laterality Date   lymph node removal     chain of 9, beign     Medications:  Current Outpatient Medications on File Prior to Visit  Medication Sig   cetirizine (ZYRTEC) 10 MG tablet TAKE 1 TABLET(10 MG) BY MOUTH DAILY AS NEEDED FOR ALLERGIES   cholecalciferol (VITAMIN D3) 25 MCG (1000 UNIT) tablet Take 1,000 Units by mouth daily.   folic acid (FOLVITE) 1 MG tablet TAKE 1 TABLET(1 MG) BY MOUTH DAILY   gabapentin (NEURONTIN) 300 MG capsule Take 1 capsule (300 mg total) by mouth 3 (three) times daily.   Multiple Vitamin (MULTIVITAMIN WITH MINERALS) TABS tablet Take 1 tablet by mouth daily.   thiamine 100 MG tablet Take 1 tablet (100 mg total) by mouth daily.   levothyroxine (SYNTHROID) 25 MCG tablet Take 1 tablet (25 mcg total) by mouth daily. (Patient  not taking: Reported on 02/11/2022)   No current facility-administered medications on file prior to visit.    Allergies:  Allergies  Allergen Reactions   Azithromycin Other (See Comments)   Penicillins Other (See Comments)    Social History:  Social History   Socioeconomic History   Marital status: Married    Spouse name: Not on file   Number of children: Not on file   Years of education: Not on file   Highest education level: Not on file  Occupational History   Not on file  Tobacco Use   Smoking status: Former    Types: Cigarettes    Quit date: 2011    Years since quitting: 13.0   Smokeless tobacco: Never  Vaping Use   Vaping Use: Never used  Substance and Sexual Activity   Alcohol use: Yes     Comment: social   Drug use: Never   Sexual activity: Yes    Birth control/protection: None  Other Topics Concern   Not on file  Social History Narrative   Not on file   Social Determinants of Health   Financial Resource Strain: Not on file  Food Insecurity: Not on file  Transportation Needs: Not on file  Physical Activity: Not on file  Stress: Not on file  Social Connections: Not on file  Intimate Partner Violence: Not on file   Social History   Tobacco Use  Smoking Status Former   Types: Cigarettes   Quit date: 2011   Years since quitting: 13.0  Smokeless Tobacco Never   Social History   Substance and Sexual Activity  Alcohol Use Yes   Comment: social    Family History:  Family History  Problem Relation Age of Onset   Fibromyalgia Mother    Heart disease Mother    Hypertension Mother    COPD Mother    Heart attack Mother    Cancer Maternal Grandmother        breast and ovarian   Heart disease Maternal Grandmother    Hypertension Maternal Grandmother    Osteopenia Paternal Grandmother     Past medical history, surgical history, medications, allergies, family history and social history reviewed with patient today and changes made to appropriate areas of the chart.   Review of Systems  Gastrointestinal:  Positive for abdominal pain. Negative for vomiting.  Genitourinary:  Positive for dysuria, frequency and urgency. Negative for flank pain and hematuria.  Psychiatric/Behavioral:  Positive for depression. Negative for suicidal ideas. The patient is nervous/anxious and has insomnia.    All other ROS negative except what is listed above and in the HPI.      Objective:    BP 124/84   Pulse 96   Temp 99 F (37.2 C) (Oral)   Ht 5' 5.5" (1.664 m)   Wt 130 lb 9.6 oz (59.2 kg)   LMP 01/06/2022 (Approximate)   SpO2 98%   BMI 21.40 kg/m   Wt Readings from Last 3 Encounters:  02/11/22 130 lb 9.6 oz (59.2 kg)  02/06/22 125 lb (56.7 kg)  10/16/20 121 lb 6.4  oz (55.1 kg)    Physical Exam Vitals and nursing note reviewed. Exam conducted with a chaperone present Yvonna Alanis, [place]).  Constitutional:      General: She is awake. She is not in acute distress.    Appearance: Normal appearance. She is well-developed. She is not ill-appearing.  HENT:     Head: Normocephalic and atraumatic.     Right Ear: Hearing, tympanic  membrane, ear canal and external ear normal. No drainage.     Left Ear: Hearing, tympanic membrane, ear canal and external ear normal. No drainage.     Nose: Nose normal.     Right Sinus: No maxillary sinus tenderness or frontal sinus tenderness.     Left Sinus: No maxillary sinus tenderness or frontal sinus tenderness.     Mouth/Throat:     Mouth: Mucous membranes are moist.     Pharynx: Oropharynx is clear. Uvula midline. No pharyngeal swelling, oropharyngeal exudate or posterior oropharyngeal erythema.  Eyes:     General: Lids are normal.        Right eye: No discharge.        Left eye: No discharge.     Extraocular Movements: Extraocular movements intact.     Conjunctiva/sclera: Conjunctivae normal.     Pupils: Pupils are equal, round, and reactive to light.     Visual Fields: Right eye visual fields normal and left eye visual fields normal.  Neck:     Thyroid: No thyromegaly.     Vascular: No carotid bruit.     Trachea: Trachea normal.  Cardiovascular:     Rate and Rhythm: Normal rate and regular rhythm.     Heart sounds: Normal heart sounds. No murmur heard.    No gallop.  Pulmonary:     Effort: Pulmonary effort is normal. No accessory muscle usage or respiratory distress.     Breath sounds: Normal breath sounds.  Chest:  Breasts:    Right: Normal.     Left: Normal.  Abdominal:     General: Bowel sounds are normal.     Palpations: Abdomen is soft. There is no hepatomegaly or splenomegaly.     Tenderness: There is no abdominal tenderness.  Genitourinary:    Exam position: Knee-chest position.     Vagina:  Normal.     Cervix: Normal.     Adnexa: Right adnexa normal and left adnexa normal.  Musculoskeletal:        General: Normal range of motion.     Cervical back: Normal range of motion and neck supple.     Right lower leg: No edema.     Left lower leg: No edema.  Lymphadenopathy:     Head:     Right side of head: No submental, submandibular, tonsillar, preauricular or posterior auricular adenopathy.     Left side of head: No submental, submandibular, tonsillar, preauricular or posterior auricular adenopathy.     Cervical: No cervical adenopathy.     Upper Body:     Right upper body: No supraclavicular, axillary or pectoral adenopathy.     Left upper body: No supraclavicular, axillary or pectoral adenopathy.  Skin:    General: Skin is warm and dry.     Capillary Refill: Capillary refill takes less than 2 seconds.     Findings: No rash.  Neurological:     Mental Status: She is alert and oriented to person, place, and time.     Gait: Gait is intact.  Psychiatric:        Attention and Perception: Attention normal.        Mood and Affect: Mood normal.        Speech: Speech normal.        Behavior: Behavior normal. Behavior is cooperative.        Thought Content: Thought content normal.        Judgment: Judgment normal.     Results for orders placed  or performed during the hospital encounter of 02/06/22  Urine Culture   Specimen: Urine, Clean Catch  Result Value Ref Range   Specimen Description      URINE, CLEAN CATCH Performed at [facility], [address], [place], [place] [address]    Special Requests      NONE Performed at [facility], [address] [place], [place] [address]    Culture (A)     <10,000 COLONIES/mL INSIGNIFICANT GROWTH Performed at [facility] [address], [place], [place] [address]    Report Status 02/07/2022 FINAL   Urinalysis, Routine w reflex microscopic  Result Value Ref Range   Color, Urine YELLOW YELLOW    APPearance CLOUDY (A) CLEAR   Specific Gravity, Urine 1.020 1.005 - 1.030   pH 8.5 (H) 5.0 - 8.0   Glucose, UA NEGATIVE NEGATIVE mg/dL   Hgb urine dipstick MODERATE (A) NEGATIVE   Bilirubin Urine SMALL (A) NEGATIVE   Ketones, ur TRACE (A) NEGATIVE mg/dL   Protein, ur 100 (A) NEGATIVE mg/dL   Nitrite NEGATIVE NEGATIVE   Leukocytes,Ua MODERATE (A) NEGATIVE  Urinalysis, Microscopic (reflex)  Result Value Ref Range   RBC / HPF 6-10 0 - 5 RBC/hpf   WBC, UA >50 0 - 5 WBC/hpf   Bacteria, UA FEW (A) NONE SEEN   Squamous Epithelial / HPF 0-5 0 - 5 /HPF   WBC Clumps PRESENT       Assessment & Plan:   Problem List Items Addressed This Visit       Digestive   Hepatic steatosis     Other   Bipolar 1 disorder (HCC)    Chronic. Weaned herself off Lamictal and feels like she is doing well.  However, she does have underlying depression and anxiety.  Has had trouble sleeping without the alcohol.  Referral placed for patient to see psychiatry.  Follow up in 1 month.        Relevant Orders   Ambulatory referral to Psychiatry   GAD (generalized anxiety disorder)    Chronic. Weaned herself off Lamictal and feels like she is doing well.  However, she does have underlying depression and anxiety.  Has had trouble sleeping without the alcohol.  Referral placed for patient to see psychiatry.  Follow up in 1 month.        Alcohol use disorder, moderate, dependence (HCC)    Chronic.  Has been using off and on over the last year.  Has been sober almost two weeks.  Referral placed for psychiatry to address underlying sleep issues, depression and anxiety to help in her sobriety.  Will start Naltrexone to help with cravings.  Side effects and benefits of medication discussed during visit today.  Start with 1/2 tab for 1 week then increase to 1 tab.  Labs ordered today.  Follow up in 1 month.  Call sooner if concerns arise.       B12 deficiency    Labs ordered at visit today.  Will make recommendations  based on lab results.        Hypercholesteremia    Labs ordered at visit today.  Will make recommendations based on lab results.        Relevant Orders   Lipid panel   Other Visit Diagnoses     Annual physical exam    -  Primary   Health maintenance reviewed during visit today.  Labs ordered.  TDAP and Flu given.  PAP repeated.  Relevant Orders   B12   CBC with Differential/Platelet   Comprehensive metabolic panel   Lipid panel   TSH   Urinalysis, Routine w reflex microscopic   Cytology - PAP   Encounter for hepatitis C screening test for low risk patient       Dysuria       Relevant Orders   HIV Antibody (routine testing w rflx)   RPR   Hepatitis C antibody   Chlamydia/Gonococcus/Trichomonas, NAA   WET PREP FOR TRICH, YEAST, CLUE   Need for influenza vaccination       Relevant Orders   Flu Vaccine QUAD 6+ mos PF IM (Fluarix Quad PF)   Need for diphtheria-tetanus-pertussis (Tdap) vaccine       Relevant Orders   Tdap vaccine greater than or equal to 7yo IM        Follow up plan: Return in about 1 month (around 03/14/2022) for Medication Management.   LABORATORY TESTING:  - Pap smear: up to date  IMMUNIZATIONS:   - Tdap: Tetanus vaccination status reviewed: last tetanus booster within 10 years. - Influenza: Up to date - Pneumovax: Not applicable - Prevnar: Not applicable - COVID: Not applicable - HPV: Not applicable - Shingrix vaccine: Not applicable  SCREENING: -Mammogram: Not applicable  - Colonoscopy: Not applicable  - Bone Density: Not applicable  -Hearing Test: Not applicable  -Spirometry: Not applicable   PATIENT COUNSELING:   Advised to take 1 mg of folate supplement per day if capable of pregnancy.   Sexuality: Discussed sexually transmitted diseases, partner selection, use of condoms, avoidance of unintended pregnancy  and contraceptive alternatives.   Advised to avoid cigarette smoking.  I discussed with the patient that most people  either abstain from alcohol or drink within safe limits (<=14/week and <=4 drinks/occasion for males, <=7/weeks and <= 3 drinks/occasion for females) and that the risk for alcohol disorders and other health effects rises proportionally with the number of drinks per week and how often a drinker exceeds daily limits.  Discussed cessation/primary prevention of drug use and availability of treatment for abuse.   Diet: Encouraged to adjust caloric intake to maintain  or achieve ideal body weight, to reduce intake of dietary saturated fat and total fat, to limit sodium intake by avoiding high sodium foods and not adding table salt, and to maintain adequate dietary potassium and calcium preferably from fresh fruits, vegetables, and low-fat dairy products.    stressed the importance of regular exercise  Injury prevention: Discussed safety belts, safety helmets, smoke detector, smoking near bedding or upholstery.   Dental health: Discussed importance of regular tooth brushing, flossing, and dental visits.    NEXT PREVENTATIVE PHYSICAL DUE IN 1 YEAR. Return in about 1 month (around 03/14/2022) for Medication Management.

## 2022-02-11 NOTE — Assessment & Plan Note (Signed)
Chronic. Weaned herself off Lamictal and feels like she is doing well.  However, she does have underlying depression and anxiety.  Has had trouble sleeping without the alcohol.  Referral placed for patient to see psychiatry.  Follow up in 1 month.

## 2022-02-12 LAB — COMPREHENSIVE METABOLIC PANEL
ALT: 18 IU/L (ref 0–32)
AST: 30 IU/L (ref 0–40)
Albumin/Globulin Ratio: 1.4 (ref 1.2–2.2)
Albumin: 3.9 g/dL (ref 3.9–4.9)
Alkaline Phosphatase: 91 IU/L (ref 44–121)
BUN/Creatinine Ratio: 6 — ABNORMAL LOW (ref 9–23)
BUN: 5 mg/dL — ABNORMAL LOW (ref 6–20)
Bilirubin Total: <0.2 mg/dL (ref 0.0–1.2)
CO2: 26 mmol/L (ref 20–29)
Calcium: 10.2 mg/dL (ref 8.7–10.2)
Chloride: 99 mmol/L (ref 96–106)
Creatinine, Ser: 0.83 mg/dL (ref 0.57–1.00)
Globulin, Total: 2.8 g/dL (ref 1.5–4.5)
Glucose: 95 mg/dL (ref 70–99)
Potassium: 4.1 mmol/L (ref 3.5–5.2)
Sodium: 142 mmol/L (ref 134–144)
Total Protein: 6.7 g/dL (ref 6.0–8.5)
eGFR: 94 mL/min/{1.73_m2} (ref >59)

## 2022-02-12 LAB — HIV ANTIBODY (ROUTINE TESTING W REFLEX): HIV Screen 4th Generation wRfx: NONREACTIVE

## 2022-02-12 LAB — CBC WITH DIFFERENTIAL/PLATELET
Basophils Absolute: 0.2 10*3/uL (ref 0.0–0.2)
Basos: 2 %
EOS (ABSOLUTE): 0.3 10*3/uL (ref 0.0–0.4)
Eos: 3 %
Hematocrit: 43 % (ref 34.0–46.6)
Hemoglobin: 13.9 g/dL (ref 11.1–15.9)
Immature Grans (Abs): 0 10*3/uL (ref 0.0–0.1)
Immature Granulocytes: 0 %
Lymphocytes Absolute: 3.5 10*3/uL — ABNORMAL HIGH (ref 0.7–3.1)
Lymphs: 40 %
MCH: 30 pg (ref 26.6–33.0)
MCHC: 32.3 g/dL (ref 31.5–35.7)
MCV: 93 fL (ref 79–97)
Monocytes Absolute: 1.1 10*3/uL — ABNORMAL HIGH (ref 0.1–0.9)
Monocytes: 12 %
Neutrophils Absolute: 3.7 10*3/uL (ref 1.4–7.0)
Neutrophils: 43 %
Platelets: 322 10*3/uL (ref 150–450)
RBC: 4.63 x10E6/uL (ref 3.77–5.28)
RDW: 16 % — ABNORMAL HIGH (ref 11.7–15.4)
WBC: 8.8 10*3/uL (ref 3.4–10.8)

## 2022-02-12 LAB — LIPID PANEL
Chol/HDL Ratio: 3.4 ratio (ref 0.0–4.4)
Cholesterol, Total: 209 mg/dL — ABNORMAL HIGH (ref 100–199)
HDL: 62 mg/dL (ref >39)
LDL Chol Calc (NIH): 112 mg/dL — ABNORMAL HIGH (ref 0–99)
Triglycerides: 204 mg/dL — ABNORMAL HIGH (ref 0–149)
VLDL Cholesterol Cal: 35 mg/dL (ref 5–40)

## 2022-02-12 LAB — TSH: TSH: 3.39 u[IU]/mL (ref 0.450–4.500)

## 2022-02-12 LAB — VITAMIN B12: Vitamin B-12: 483 pg/mL (ref 232–1245)

## 2022-02-12 LAB — RPR: RPR Ser Ql: NONREACTIVE

## 2022-02-12 LAB — HEPATITIS C ANTIBODY: Hep C Virus Ab: NONREACTIVE

## 2022-02-12 NOTE — Progress Notes (Signed)
Hi Hailey Castaneda.  It was nice to see you yesterday.  Your lab work looks good.  Your anemia has improved.  Your liver function has improved.  Your cholesterol improved also.  Keep up the good work!  No concerns at this time. Continue with your current medication regimen.  Follow up as discussed.  Please let me know if you have any questions.

## 2022-02-12 NOTE — Telephone Encounter (Signed)
Requested medication (s) are due for refill today: expired medication  Requested medication (s) are on the active medication list: yes  Last refill:  10/27/20 #270 1 refill  Future visit scheduled: yes in 1 month  Notes to clinic:  expired medication . Do you want to renew Rx?     Requested Prescriptions  Pending Prescriptions Disp Refills   gabapentin (NEURONTIN) 300 MG capsule [Pharmacy Med Name: GABAPENTIN 300MG  CAPSULES] 270 capsule 1    Sig: TAKE 1 CAPSULE(300 MG) BY MOUTH THREE TIMES DAILY     Neurology: Anticonvulsants - gabapentin Failed - 02/11/2022 10:28 AM      Failed - Cr in normal range and within 360 days    Creatinine, Ser  Date Value Ref Range Status  02/11/2022 0.83 0.57 - 1.00 mg/dL Final         Passed - Completed PHQ-2 or PHQ-9 in the last 360 days      Passed - Valid encounter within last 12 months    Recent Outpatient Visits           Yesterday Annual physical exam   [place] Jon Billings, NP   1 year ago Sore throat   [place] McElwee, Lauren A, NP   1 year ago Alcohol use disorder, moderate, dependence ([place])   [place] Jon Billings, NP   1 year ago Acute cystitis without hematuria   [place] Jon Billings, NP   1 year ago Annual physical exam   [place] Jon Billings, NP       Future Appointments             In 1 month Jon Billings, NP [place]

## 2022-02-13 LAB — CHLAMYDIA/GONOCOCCUS/TRICHOMONAS, NAA
Chlamydia by NAA: NEGATIVE
Gonococcus by NAA: NEGATIVE
Trich vag by NAA: NEGATIVE

## 2022-02-16 LAB — CYTOLOGY - PAP
Adequacy: ABSENT
Diagnosis: NEGATIVE

## 2022-02-18 NOTE — Progress Notes (Signed)
HI Hailey Castaneda. Your PAP was normal.  We will repeat it in 5 years.

## 2022-02-22 ENCOUNTER — Ambulatory Visit: Payer: Self-pay | Admitting: *Deleted

## 2022-02-22 NOTE — Telephone Encounter (Signed)
  Chief Complaint: vaginal discharge after completing course of flagyl due to BV 02/11/22 Symptoms: c/o itching, white thick, gritty discharge Frequency: na Pertinent Negatives: Patient denies fever no odor Disposition: [] ED /[] Urgent Care (no appt availability in office) / [] Appointment(In office/virtual)/ []  [place] [facility]/ [x] Home Care/ [] Refused Recommended Disposition /[] [place] [facility]/ []  Follow-up with PCP Additional Notes:   Please advise if medication can be prescribed.     Reason for Disposition  Symptoms of a vaginal yeast infection (i.e., white, thick, cottage-cheese-like, itchy, not bad smelling discharge)  Answer Assessment - Initial Assessment Questions 1. DISCHARGE: "Describe the discharge." (e.g., white, yellow, green, gray, foamy, cottage cheese-like)     White thick gritty  2. ODOR: "Is there a bad odor?"     no 3. ONSET: "When did the discharge begin?"     Na  4. RASH: "Is there a rash in the genital area?" If Yes, ask: "Describe it." (e.g., redness, blisters, sores, bumps)     no 5. ABDOMEN PAIN: "Are you having any abdomen pain?" If Yes, ask: "What does it feel like? " (e.g., crampy, dull, intermittent, constant)      no 6. ABDOMEN PAIN SEVERITY: If present, ask: "How bad is it?" (e.g., Scale 1-10; mild, moderate, or severe)   - MILD (1-3): Doesn't interfere with normal activities, abdomen soft and not tender to touch.    - MODERATE (4-7): Interferes with normal activities or awakens from sleep, abdomen tender to touch.    - SEVERE (8-10): Excruciating pain, doubled over, unable to do any normal activities. (R/O peritonitis)      no 7. CAUSE: "What do you think is causing the discharge?" "Have you had the same problem before? What happened then?"     Not sure just completed flagyl course for BV 8. OTHER SYMPTOMS: "Do you have any other symptoms?" (e.g., fever, itching, vaginal bleeding, pain with urination, injury to genital area, vaginal foreign  body)     Vaginal discharge white thick gritty. Itching  9. PREGNANCY: "Is there any chance you are pregnant?" "When was your last menstrual period?"     na  Protocols used: Vaginal Discharge-A-AH

## 2022-02-25 MED ORDER — FLUCONAZOLE 150 MG PO TABS: 150.0000 mg | ORAL_TABLET | Freq: Once | ORAL | 0 refills | Status: AC

## 2022-02-25 NOTE — Telephone Encounter (Signed)
Called and LVM notifying patient of medication being sent in. DPR verified for VM.

## 2022-02-25 NOTE — Telephone Encounter (Signed)
I sent in a course of diflucan to treat for yeast.

## 2022-03-14 ENCOUNTER — Ambulatory Visit (INDEPENDENT_AMBULATORY_CARE_PROVIDER_SITE_OTHER): Payer: BC Managed Care – PPO | Admitting: Nurse Practitioner

## 2022-03-14 ENCOUNTER — Encounter: Payer: Self-pay | Admitting: Nurse Practitioner

## 2022-03-14 VITALS — BP 108/73 | HR 90 | Temp 98.1°F | Wt 133.7 lb

## 2022-03-14 DIAGNOSIS — F1020 Alcohol dependence, uncomplicated: Secondary | ICD-10-CM | POA: Diagnosis not present

## 2022-03-14 MED ORDER — NALTREXONE HCL 50 MG PO TABS: 50.0000 mg | ORAL_TABLET | Freq: Every day | ORAL | 0 refills | Status: DC

## 2022-03-14 NOTE — Assessment & Plan Note (Signed)
Chronic.  Improved.  Doing well with Naltrexone.  Has been sober for 6-7 weeks.  CMP ordered today.  Follow up in 2 months.  Call sooner if concerns arise.

## 2022-03-14 NOTE — Progress Notes (Signed)
BP 108/73   Pulse 90   Temp 98.1 F (36.7 C) (Oral)   Wt 133 lb 11.2 oz (60.6 kg)   SpO2 98%   BMI 21.91 kg/m    Subjective:    Patient ID: Hailey Castaneda, female    DOB: 1985-12-08, 37 y.o.   MRN: [id number]  HPI: Hailey Castaneda is a 37 y.o. female  Chief Complaint  Patient presents with   Alcohol Problem    Pt states she has been sober for the last 7 weeks    MOOD Patient has been sober for 6-7 weeks.  States she hasn't had the urge to drink since she has been taking the naltrexone and feels like it is helping her.  Denies concerns at visit today.  Feels really good and is eating healthier. Neuropathy is improving.   Relevant past medical, surgical, family and social history reviewed and updated as indicated. Interim medical history since our last visit reviewed. Allergies and medications reviewed and updated.  Review of Systems  Neurological:  Negative for numbness.  Psychiatric/Behavioral:  Negative for dysphoric mood. The patient is not nervous/anxious.     Per HPI unless specifically indicated above     Objective:    BP 108/73   Pulse 90   Temp 98.1 F (36.7 C) (Oral)   Wt 133 lb 11.2 oz (60.6 kg)   SpO2 98%   BMI 21.91 kg/m   Wt Readings from Last 3 Encounters:  03/14/22 133 lb 11.2 oz (60.6 kg)  02/11/22 130 lb 9.6 oz (59.2 kg)  02/06/22 125 lb (56.7 kg)    Physical Exam Vitals and nursing note reviewed.  Constitutional:      General: She is not in acute distress.    Appearance: Normal appearance. She is normal weight. She is not ill-appearing, toxic-appearing or diaphoretic.  HENT:     Head: Normocephalic.     Right Ear: External ear normal.     Left Ear: External ear normal.     Nose: Nose normal.     Mouth/Throat:     Mouth: Mucous membranes are moist.     Pharynx: Oropharynx is clear.  Eyes:     General:        Right eye: No discharge.        Left eye: No discharge.     Extraocular Movements: Extraocular movements intact.      Conjunctiva/sclera: Conjunctivae normal.     Pupils: Pupils are equal, round, and reactive to light.  Cardiovascular:     Rate and Rhythm: Normal rate and regular rhythm.     Heart sounds: No murmur heard. Pulmonary:     Effort: Pulmonary effort is normal. No respiratory distress.     Breath sounds: Normal breath sounds. No wheezing or rales.  Musculoskeletal:     Cervical back: Normal range of motion and neck supple.  Skin:    General: Skin is warm and dry.     Capillary Refill: Capillary refill takes less than 2 seconds.  Neurological:     General: No focal deficit present.     Mental Status: She is alert and oriented to person, place, and time. Mental status is at baseline.  Psychiatric:        Mood and Affect: Mood normal.        Behavior: Behavior normal.        Thought Content: Thought content normal.        Judgment: Judgment normal.     Results  for orders placed or performed in visit on 02/11/22  Chlamydia/Gonococcus/Trichomonas, NAA   Specimen: Urine   UR  Result Value Ref Range   Chlamydia by NAA Negative Negative   Gonococcus by NAA Negative Negative   Trich vag by NAA Negative Negative  WET PREP FOR TRICH, YEAST, CLUE   Specimen: Sterile Swab   Sterile Swab  Result Value Ref Range   Trichomonas Exam Negative Negative   Yeast Exam Negative Negative   Clue Cell Exam Positive (A) Negative  Microscopic Examination   Urine  Result Value Ref Range   WBC, UA 0-5 0 - 5 /hpf   RBC, Urine None seen 0 - 2 /hpf   Epithelial Cells (non renal) 0-10 0 - 10 /hpf   Crystals Present (A) N/A   Crystal Type Amorphous Sediment N/A   Bacteria, UA Few None seen/Few  B12  Result Value Ref Range   Vitamin B-12 483 232 - 1,245 pg/mL  CBC with Differential/Platelet  Result Value Ref Range   WBC 8.8 3.4 - 10.8 x10E3/uL   RBC 4.63 3.77 - 5.28 x10E6/uL   Hemoglobin 13.9 11.1 - 15.9 g/dL   Hematocrit 43.0 34.0 - 46.6 %   MCV 93 79 - 97 fL   MCH 30.0 26.6 - 33.0 pg   MCHC 32.3  31.5 - 35.7 g/dL   RDW 16.0 (H) 11.7 - 15.4 %   Platelets 322 150 - 450 x10E3/uL   Neutrophils 43 Not Estab. %   Lymphs 40 Not Estab. %   Monocytes 12 Not Estab. %   Eos 3 Not Estab. %   Basos 2 Not Estab. %   Neutrophils Absolute 3.7 1.4 - 7.0 x10E3/uL   Lymphocytes Absolute 3.5 (H) 0.7 - 3.1 x10E3/uL   Monocytes Absolute 1.1 (H) 0.1 - 0.9 x10E3/uL   EOS (ABSOLUTE) 0.3 0.0 - 0.4 x10E3/uL   Basophils Absolute 0.2 0.0 - 0.2 x10E3/uL   Immature Granulocytes 0 Not Estab. %   Immature Grans (Abs) 0.0 0.0 - 0.1 x10E3/uL  Comprehensive metabolic panel  Result Value Ref Range   Glucose 95 70 - 99 mg/dL   BUN 5 (L) 6 - 20 mg/dL   Creatinine, Ser 0.83 0.57 - 1.00 mg/dL   eGFR 94 >59 mL/min/1.73   BUN/Creatinine Ratio 6 (L) 9 - 23   Sodium 142 134 - 144 mmol/L   Potassium 4.1 3.5 - 5.2 mmol/L   Chloride 99 96 - 106 mmol/L   CO2 26 20 - 29 mmol/L   Calcium 10.2 8.7 - 10.2 mg/dL   Total Protein 6.7 6.0 - 8.5 g/dL   Albumin 3.9 3.9 - 4.9 g/dL   Globulin, Total 2.8 1.5 - 4.5 g/dL   Albumin/Globulin Ratio 1.4 1.2 - 2.2   Bilirubin Total <0.2 0.0 - 1.2 mg/dL   Alkaline Phosphatase 91 44 - 121 IU/L   AST 30 0 - 40 IU/L   ALT 18 0 - 32 IU/L  Lipid panel  Result Value Ref Range   Cholesterol, Total 209 (H) 100 - 199 mg/dL   Triglycerides 204 (H) 0 - 149 mg/dL   HDL 62 >39 mg/dL   VLDL Cholesterol Cal 35 5 - 40 mg/dL   LDL Chol Calc (NIH) 112 (H) 0 - 99 mg/dL   Chol/HDL Ratio 3.4 0.0 - 4.4 ratio  TSH  Result Value Ref Range   TSH 3.390 0.450 - 4.500 uIU/mL  Urinalysis, Routine w reflex microscopic  Result Value Ref Range   Specific Gravity, UA  1.015 1.005 - 1.030   pH, UA CANCELED    Color, UA Orange Yellow   Appearance Ur Cloudy (A) Clear   Protein,UA CANCELED    Glucose, UA CANCELED    Ketones, UA CANCELED    Microscopic Examination See below:   HIV Antibody (routine testing w rflx)  Result Value Ref Range   HIV Screen 4th Generation wRfx Non Reactive Non Reactive  RPR   Result Value Ref Range   RPR Ser Ql Non Reactive Non Reactive  Hepatitis C antibody  Result Value Ref Range   Hep C Virus Ab Non Reactive Non Reactive  Cytology - PAP  Result Value Ref Range   Adequacy      Satisfactory for evaluation; transformation zone component ABSENT.   Diagnosis      - Negative for intraepithelial lesion or malignancy (NILM)   Microorganisms Shift in flora suggestive of bacterial vaginosis       Assessment & Plan:   Problem List Items Addressed This Visit       Other   Alcohol use disorder, moderate, dependence ([place]) - Primary    Chronic.  Improved.  Doing well with Naltrexone.  Has been sober for 6-7 weeks.  CMP ordered today.  Follow up in 2 months.  Call sooner if concerns arise.       Relevant Orders   Comp Met (CMET)     Follow up plan: Return in about 2 months (around 05/13/2022) for Medication Management.

## 2022-03-16 LAB — COMPREHENSIVE METABOLIC PANEL
ALT: 15 IU/L (ref 0–32)
AST: 31 IU/L (ref 0–40)
Albumin/Globulin Ratio: 1.3 (ref 1.2–2.2)
Albumin: 3.7 g/dL — ABNORMAL LOW (ref 3.9–4.9)
Alkaline Phosphatase: 83 IU/L (ref 44–121)
BUN/Creatinine Ratio: 13 (ref 9–23)
BUN: 8 mg/dL (ref 6–20)
Bilirubin Total: <0.2 mg/dL (ref 0.0–1.2)
CO2: 23 mmol/L (ref 20–29)
Calcium: 9.7 mg/dL (ref 8.7–10.2)
Chloride: 100 mmol/L (ref 96–106)
Creatinine, Ser: 0.6 mg/dL (ref 0.57–1.00)
Globulin, Total: 2.8 g/dL (ref 1.5–4.5)
Glucose: 102 mg/dL — ABNORMAL HIGH (ref 70–99)
Potassium: 4.5 mmol/L (ref 3.5–5.2)
Sodium: 137 mmol/L (ref 134–144)
Total Protein: 6.5 g/dL (ref 6.0–8.5)
eGFR: 119 mL/min/{1.73_m2} (ref >59)

## 2022-03-18 NOTE — Progress Notes (Signed)
Hi Hailey Castaneda. It was nice to see you last week.  Your lab work looks good.  No concerns at this time. Continue with your current medication regimen.  Follow up as discussed.  Please let me know if you have any questions.

## 2022-03-27 DIAGNOSIS — Z79899 Other long term (current) drug therapy: Secondary | ICD-10-CM | POA: Diagnosis not present

## 2022-03-27 DIAGNOSIS — L400 Psoriasis vulgaris: Secondary | ICD-10-CM | POA: Diagnosis not present

## 2022-05-08 ENCOUNTER — Other Ambulatory Visit: Payer: Self-pay | Admitting: Nurse Practitioner

## 2022-05-09 NOTE — Telephone Encounter (Signed)
Requested medication (s) are due for refill today: review  Requested medication (s) are on the active medication list: no  Last refill:  04/20/21   Future visit scheduled: no  Notes to clinic:  discontinued on 03/14/2022 by Pablo Ledger, CMA for the following reason: Patient Preference.      Requested Prescriptions  Pending Prescriptions Disp Refills   folic acid (FOLVITE) 1 MG tablet [Pharmacy Med Name: FOLIC ACID  TABLETS] 90 tablet 1    Sig: TAKE 1 TABLET(1 MG) BY MOUTH DAILY     Endocrinology:  Vitamins Passed - 05/08/2022  7:15 PM      Passed - Valid encounter within last 12 months    Recent Outpatient Visits           1 month ago Alcohol use disorder, moderate, dependence (HCC)   [place] [facility] Larae Grooms, NP   2 months ago Annual physical exam   [place] [facility] Larae Grooms, NP   1 year ago Sore throat   [place] [facility] McElwee, Lauren A, NP   1 year ago Alcohol use disorder, moderate, dependence (HCC)   [place] [facility] Larae Grooms, NP   1 year ago Acute cystitis without hematuria   [place] [facility] Larae Grooms, NP

## 2022-06-07 ENCOUNTER — Encounter: Payer: Self-pay | Admitting: Nurse Practitioner

## 2022-06-21 ENCOUNTER — Other Ambulatory Visit: Payer: Self-pay | Admitting: Nurse Practitioner

## 2022-06-21 NOTE — Telephone Encounter (Signed)
Unable to refill per protocol, Rx request is too soon. Last refill 02/12/22 for 90 and 1 refill.  Requested Prescriptions  Pending Prescriptions Disp Refills   gabapentin (NEURONTIN) 300 MG capsule [Pharmacy Med Name: GABAPENTIN 300MG  CAPSULES] 270 capsule 1    Sig: TAKE 1 CAPSULE(300 MG) BY MOUTH THREE TIMES DAILY     Neurology: Anticonvulsants - gabapentin Passed - 06/21/2022  8:09 AM      Passed - Cr in normal range and within 360 days    Creatinine, Ser  Date Value Ref Range Status  03/14/2022 0.60 0.57 - 1.00 mg/dL Final         Passed - Completed PHQ-2 or PHQ-9 in the last 360 days      Passed - Valid encounter within last 12 months    Recent Outpatient Visits           3 months ago Alcohol use disorder, moderate, dependence (HCC)   [place] [facility] Larae Grooms, NP   4 months ago Annual physical exam   [place] [facility] Larae Grooms, NP   1 year ago Sore throat   [place] [facility] McElwee, Lauren A, NP   1 year ago Alcohol use disorder, moderate, dependence (HCC)   [place] [facility] Larae Grooms, NP   1 year ago Acute cystitis without hematuria   [place] [facility] Larae Grooms, NP

## 2022-07-31 ENCOUNTER — Other Ambulatory Visit: Payer: Self-pay | Admitting: Nurse Practitioner

## 2022-08-01 NOTE — Telephone Encounter (Signed)
Requested medication (s) are due for refill today: yes  Requested medication (s) are on the active medication list: yes  Last refill:  03/14/22  Future visit scheduled: no  Notes to clinic:  Unable to refill per protocol, cannot delegate.      Requested Prescriptions  Pending Prescriptions Disp Refills   naltrexone (DEPADE) 50 MG tablet [Pharmacy Med Name: NALTREXONE 50MG  TABLETS] 30 tablet     Sig: TAKE 1/2 TABLET(25 MG) BY MOUTH DAILY FOR 1 WEEK. INCREASE TO 1 TABLET DAILY     Not Delegated - Psychiatry: Drug Dependence Therapy - naltrexone Failed - 07/31/2022  3:06 PM      Failed - This refill cannot be delegated      Passed - Completed PHQ-2 or PHQ-9 in the last 360 days      Passed - Valid encounter within last 6 months    Recent Outpatient Visits           4 months ago Alcohol use disorder, moderate, dependence (HCC)   [place] [facility] Larae Grooms, NP   5 months ago Annual physical exam   [place] [facility] Larae Grooms, NP   1 year ago Sore throat   [place] [facility] McElwee, Lauren A, NP   1 year ago Alcohol use disorder, moderate, dependence (HCC)   [place] [facility] Larae Grooms, NP   1 year ago Acute cystitis without hematuria   [place] [facility] Larae Grooms, NP

## 2022-08-06 ENCOUNTER — Ambulatory Visit: Payer: BC Managed Care – PPO | Admitting: Nurse Practitioner

## 2022-08-06 ENCOUNTER — Telehealth: Payer: Self-pay | Admitting: Nurse Practitioner

## 2022-08-06 MED ORDER — NALTREXONE HCL 50 MG PO TABS: 50.0000 mg | ORAL_TABLET | Freq: Every day | ORAL | 0 refills | Status: DC

## 2022-08-06 NOTE — Telephone Encounter (Signed)
Copied from CRM [id number]. Topic: General - Other >> Aug 06, 2022  7:43 AM Everette C wrote: Reason for CRM: The patient would like to speak with a member of clinical staff regarding their previous refill request of naltrexone (DEPADE) 50 MG tablet [[id number]]   The patient shares that they've been unable to take their medication consistently and has stressed the urgency of their request   Please contact the patient further if needed

## 2022-08-06 NOTE — Telephone Encounter (Signed)
Spoke with patient to follow up on her call and she says she is wanting to know why her refill request was denied for her prescription of Naltrexone. Patient was notified that she has not had an in office follow up appointment since February 2024 and Larae Grooms, NP wanted her to follow up in two months (April). Patient says she could not come in today, as she had to take her nephew to his doctor's appointment. Patient is scheduled for Monday 08/12/22 at 2:20 PM with Denny Peon Mecum, PA. Patient verbalized understanding.

## 2022-08-08 ENCOUNTER — Other Ambulatory Visit: Payer: Self-pay | Admitting: Nurse Practitioner

## 2022-08-08 NOTE — Telephone Encounter (Signed)
Requested medication (s) are due for refill today - no  Requested medication (s) are on the active medication list -yes  Future visit scheduled -yes  Last refill: 08/06/22 #11  Notes to clinic: duplicate request- non delegated Rx  Requested Prescriptions  Pending Prescriptions Disp Refills   naltrexone (DEPADE) 50 MG tablet [Pharmacy Med Name: NALTREXONE 50 MG TAB] 34 tablet 0    Sig: TAKE ONE-HALF TABLET BY MOUTH ONE TIME DAILY FOR ONE WEEK, THEN INCREASE TO TAKE 1 TABLET BY MOUTH ONCE DAILY     Not Delegated - Psychiatry: Drug Dependence Therapy - naltrexone Failed - 08/08/2022  8:33 AM      Failed - This refill cannot be delegated      Passed - Completed PHQ-2 or PHQ-9 in the last 360 days      Passed - Valid encounter within last 6 months    Recent Outpatient Visits           4 months ago Alcohol use disorder, moderate, dependence (HCC)   [place] [facility] Larae Grooms, NP   5 months ago Annual physical exam   [place] [facility] Larae Grooms, NP   1 year ago Sore throat   [place] [facility] McElwee, Lauren A, NP   1 year ago Alcohol use disorder, moderate, dependence (HCC)   [place] [facility] Larae Grooms, NP   1 year ago Acute cystitis without hematuria   [place] [facility] Larae Grooms, NP       Future Appointments             In 4 days Mecum, Oswaldo Conroy, PA-C [facility]               Requested Prescriptions  Pending Prescriptions Disp Refills   naltrexone (DEPADE) 50 MG tablet [Pharmacy Med Name: NALTREXONE 50 MG TAB] 34 tablet 0    Sig: TAKE ONE-HALF TABLET BY MOUTH ONE TIME DAILY FOR ONE WEEK, THEN INCREASE TO TAKE 1 TABLET BY MOUTH ONCE DAILY     Not Delegated - Psychiatry: Drug Dependence Therapy - naltrexone Failed - 08/08/2022  8:33 AM      Failed - This refill cannot be delegated      Passed - Completed PHQ-2  or PHQ-9 in the last 360 days      Passed - Valid encounter within last 6 months    Recent Outpatient Visits           4 months ago Alcohol use disorder, moderate, dependence (HCC)   [place] [facility] Larae Grooms, NP   5 months ago Annual physical exam   [place] [facility] Larae Grooms, NP   1 year ago Sore throat   [place] [facility] McElwee, Lauren A, NP   1 year ago Alcohol use disorder, moderate, dependence (HCC)   [place] [facility] Larae Grooms, NP   1 year ago Acute cystitis without hematuria   [place] [facility] Larae Grooms, NP       Future Appointments             In 4 days Mecum, Oswaldo Conroy, PA-C  [facility]

## 2022-08-12 ENCOUNTER — Ambulatory Visit (INDEPENDENT_AMBULATORY_CARE_PROVIDER_SITE_OTHER): Payer: BC Managed Care – PPO | Admitting: Physician Assistant

## 2022-08-12 VITALS — BP 110/71 | HR 71 | Ht 65.5 in | Wt 118.2 lb

## 2022-08-12 DIAGNOSIS — H66003 Acute suppurative otitis media without spontaneous rupture of ear drum, bilateral: Secondary | ICD-10-CM | POA: Diagnosis not present

## 2022-08-12 DIAGNOSIS — F319 Bipolar disorder, unspecified: Secondary | ICD-10-CM

## 2022-08-12 DIAGNOSIS — F1020 Alcohol dependence, uncomplicated: Secondary | ICD-10-CM

## 2022-08-12 DIAGNOSIS — F411 Generalized anxiety disorder: Secondary | ICD-10-CM

## 2022-08-12 MED ORDER — NALTREXONE HCL 50 MG PO TABS: 50.0000 mg | ORAL_TABLET | Freq: Every day | ORAL | 1 refills | Status: DC

## 2022-08-12 MED ORDER — CEFDINIR 300 MG PO CAPS: 300.0000 mg | ORAL_CAPSULE | Freq: Two times a day (BID) | ORAL | 0 refills | Status: AC

## 2022-08-12 NOTE — Progress Notes (Unsigned)
Acute Office Visit   Patient: Hailey Castaneda   DOB: May 06, 1985   37 y.o. Female  MRN: [id number] Visit Date: 08/12/2022  Today's healthcare provider: Oswaldo Conroy Gelila Well, PA-C  Introduced myself to the patient as a Secondary school teacher and provided education on APPs in clinical practice.    Chief Complaint  Patient presents with   Medication Management   Ear Problem    Patient says she having a lot of fluid come from her ears. Patient says she is noticing some swelling in her ears and says she noticing some a lot of itching. Patient says she has ongoing issues with her ears and says they have been really bad the past three weeks.    Subjective    HPI HPI     Ear Problem    Additional comments: Patient says she having a lot of fluid come from her ears. Patient says she is noticing some swelling in her ears and says she noticing some a lot of itching. Patient says she has ongoing issues with her ears and says they have been really bad the past three weeks.       Last edited by Malen Gauze, CMA on 08/12/2022  2:30 PM.      Alcohol Use Disorder   Has been using Naltrexone  She reports she is almost 7 months sober and would like to check vitamin, liver and electrolytes She reports she has reduced appetite and has to force herself to eat sometimes - states this has been ongoing since she stopped drinking and has become more severe the past few months  She has lost about 12 lbs since Feb  She has been a vegetarian for about 20 years but states "eating feels like a chore"   Acute Ear concerns She reports her ears feel very dry and thinks her psoriasis is flaring  She states she has not been taking her medications  Onset: gradual  Duration: for about 3 weeks  Associated symptoms: she reports bilateral ear pain and itching  Interventions: nothing     Medications: Outpatient Medications Prior to Visit  Medication Sig   cholecalciferol (VITAMIN D3) 25 MCG (1000 UNIT) tablet Take 1,000  Units by mouth daily.   gabapentin (NEURONTIN) 300 MG capsule TAKE 1 CAPSULE(300 MG) BY MOUTH THREE TIMES DAILY   naltrexone (DEPADE) 50 MG tablet Take 1 tablet (50 mg total) by mouth daily.   cetirizine (ZYRTEC) 10 MG tablet TAKE 1 TABLET(10 MG) BY MOUTH DAILY AS NEEDED FOR ALLERGIES (Patient not taking: Reported on 08/12/2022)   No facility-administered medications prior to visit.    Review of Systems  Constitutional:  Negative for chills and fever.  HENT:  Positive for ear pain. Negative for ear discharge.     {Insert previous labs (optional):23779}  {See past labs  Heme  Chem  Endocrine  Serology  Results Review (optional):1}   Objective    BP 110/71   Pulse 71   Ht 5' 5.5" (1.664 m)   Wt 118 lb 3.2 oz (53.6 kg)   SpO2 99%   BMI 19.37 kg/m  {Insert last BP/Wt (optional):23777}  {See vitals history (optional):1}  Physical Exam Vitals reviewed.  Constitutional:      General: She is awake.     Appearance: Normal appearance.  HENT:     Head: Normocephalic and atraumatic.     Right Ear: Hearing normal. No decreased hearing noted. Tenderness present. No laceration, drainage or swelling. There  is no impacted cerumen. No foreign body. Tympanic membrane is erythematous and bulging. Tympanic membrane is not perforated.     Left Ear: Hearing normal. No decreased hearing noted. Tenderness present. No laceration, drainage or swelling. There is no impacted cerumen. No foreign body. Tympanic membrane is erythematous. Tympanic membrane is not perforated, retracted or bulging.     Ears:     Comments: Opaque and swollen right TM     Mouth/Throat:     Lips: Pink.     Mouth: Mucous membranes are moist.     Pharynx: Oropharynx is clear. Uvula midline. No oropharyngeal exudate or posterior oropharyngeal erythema.  Cardiovascular:     Rate and Rhythm: Normal rate and regular rhythm.     Pulses: Normal pulses.     Heart sounds: Normal heart sounds.  Pulmonary:     Effort: Pulmonary  effort is normal.     Breath sounds: Normal breath sounds.  Musculoskeletal:     Right lower leg: No edema.     Left lower leg: No edema.  Neurological:     Mental Status: She is alert.  Psychiatric:        Behavior: Behavior is cooperative.       No results found for any visits on 08/12/22.  Assessment & Plan      No follow-ups on file.      Problem List Items Addressed This Visit       Other   Bipolar 1 disorder (HCC) - Primary   Relevant Orders   Ambulatory referral to Psychiatry   GAD (generalized anxiety disorder)   Relevant Orders   Ambulatory referral to Psychiatry   Alcohol use disorder, moderate, dependence (HCC)   Relevant Medications   naltrexone (DEPADE) 50 MG tablet   Other Relevant Orders   Comp Met (CMET)   CBC w/Diff   B12   Vitamin B1   Ambulatory referral to Psychiatry   Other Visit Diagnoses     Acute suppurative otitis media of both ears without spontaneous rupture of tympanic membranes, recurrence not specified       Relevant Medications   cefdinir (OMNICEF) 300 MG capsule        Return in about 3 months (around 11/12/2022) for Mood, Naltrexone.   I, Blimy E Kahliya Fraleigh, PA-C, have reviewed all documentation for this visit. The documentation on 08/12/22 for the exam, diagnosis, procedures, and orders are all accurate and complete.   Jacquelin Hawking, MHS, PA-C [facility]

## 2022-08-13 LAB — CBC WITH DIFFERENTIAL/PLATELET
Basophils Absolute: 0.1 10*3/uL (ref 0.0–0.2)
Basos: 1 %
EOS (ABSOLUTE): 0.3 10*3/uL (ref 0.0–0.4)
Immature Granulocytes: 0 %
Lymphocytes Absolute: 2.9 10*3/uL (ref 0.7–3.1)
Lymphs: 32 %
MCH: 27.8 pg (ref 26.6–33.0)
MCHC: 32.9 g/dL (ref 31.5–35.7)
Monocytes Absolute: 0.9 10*3/uL (ref 0.1–0.9)
Monocytes: 10 %
Neutrophils Absolute: 4.9 10*3/uL (ref 1.4–7.0)
Neutrophils: 54 %
Platelets: 218 10*3/uL (ref 150–450)
RBC: 4.93 x10E6/uL (ref 3.77–5.28)
WBC: 9.1 10*3/uL (ref 3.4–10.8)

## 2022-08-13 LAB — COMPREHENSIVE METABOLIC PANEL
Albumin: 4.1 g/dL (ref 3.9–4.9)
Alkaline Phosphatase: 71 IU/L (ref 44–121)
Bilirubin Total: 0.3 mg/dL (ref 0.0–1.2)
CO2: 21 mmol/L (ref 20–29)
Chloride: 105 mmol/L (ref 96–106)
Creatinine, Ser: 0.77 mg/dL (ref 0.57–1.00)
Glucose: 94 mg/dL (ref 70–99)
Sodium: 140 mmol/L (ref 134–144)
Total Protein: 6.7 g/dL (ref 6.0–8.5)
eGFR: 102 mL/min/{1.73_m2} (ref >59)

## 2022-08-13 LAB — VITAMIN B1

## 2022-08-13 NOTE — Assessment & Plan Note (Signed)
Chronic, currently not managed She has weaned herself off medications but reports some ongoing concerns Referral placed to Psychiatry today for further management

## 2022-08-13 NOTE — Assessment & Plan Note (Signed)
Chronic, appears to be in partial remission with Naltrexone use  She states she has been sober for 7 months and would like to check labs today- ordered  She reports persistent decreased appetite since she stopped drinking- suspect this may be multifactorial as she also stopped her psych medications around this time  Will refill Naltrexone today  Will send referral to Psychiatry services as well  Follow up in about 3 months or sooner if concerns arise

## 2022-08-15 LAB — COMPREHENSIVE METABOLIC PANEL
ALT: 15 IU/L (ref 0–32)
AST: 19 IU/L (ref 0–40)
BUN/Creatinine Ratio: 12 (ref 9–23)
BUN: 9 mg/dL (ref 6–20)
Calcium: 9.7 mg/dL (ref 8.7–10.2)
Globulin, Total: 2.6 g/dL (ref 1.5–4.5)
Potassium: 4.1 mmol/L (ref 3.5–5.2)

## 2022-08-15 LAB — CBC WITH DIFFERENTIAL/PLATELET
Eos: 3 %
Hematocrit: 41.7 % (ref 34.0–46.6)
Hemoglobin: 13.7 g/dL (ref 11.1–15.9)
Immature Grans (Abs): 0 10*3/uL (ref 0.0–0.1)
MCV: 85 fL (ref 79–97)
RDW: 14 % (ref 11.7–15.4)

## 2022-08-15 LAB — VITAMIN B12: Vitamin B-12: 381 pg/mL (ref 232–1245)

## 2022-08-15 NOTE — Progress Notes (Signed)
Labs are normal/stable.

## 2022-09-04 ENCOUNTER — Ambulatory Visit: Payer: BC Managed Care – PPO

## 2022-09-04 ENCOUNTER — Ambulatory Visit
Admission: EM | Admit: 2022-09-04 | Discharge: 2022-09-04 | Disposition: A | Discharge status: Home / Self Care | Payer: BC Managed Care – PPO | Attending: Emergency Medicine | Admitting: Emergency Medicine

## 2022-09-04 DIAGNOSIS — S20212A Contusion of left front wall of thorax, initial encounter: Secondary | ICD-10-CM

## 2022-09-04 DIAGNOSIS — R0789 Other chest pain: Secondary | ICD-10-CM | POA: Diagnosis not present

## 2022-09-04 MED ORDER — KETOROLAC TROMETHAMINE 30 MG/ML IJ SOLN
30.0000 mg | Freq: Once | INTRAMUSCULAR | Status: AC
  Administered 2022-09-04: 30 mg via INTRAMUSCULAR

## 2022-09-04 MED ORDER — KETOROLAC TROMETHAMINE 10 MG PO TABS: 10.0000 mg | ORAL_TABLET | Freq: Four times a day (QID) | ORAL | 0 refills | Status: DC | PRN

## 2022-09-04 NOTE — ED Provider Notes (Signed)
[facility]    CSN: [id number] Arrival date & time: 09/04/22  0955      History   Chief Complaint Chief Complaint  Patient presents with   Fall    HPI Hailey Castaneda is a 37 y.o. female.   HPI  37 year old female with a past medical history significant for bipolar type I, GAD, and alcohol abuse presents for evaluation of left-sided chest wall pain that started yesterday after suffering a ground-level fall.  She reports that she was carrying boxes through a parking lot and tripped over one of the concrete distentions.  She reports that the box pressed into her ribs.  She has been having pain with movement and deep breathing ever since.  She has taken over-the-counter Tylenol and ibuprofen without any significant improvement of symptoms.    Past Medical History:  Diagnosis Date   Anxiety    Bipolar 1 disorder (HCC)    Sinus disorder     Patient Active Problem List   Diagnosis Date Noted   Hypercholesteremia 02/11/2022   Numbness and tingling 09/28/2020   B12 deficiency 09/28/2020   Alcohol use disorder, moderate, dependence (HCC) 09/11/2020   Hepatic steatosis 09/11/2020   Hypokalemia 09/11/2020   Macrocytic anemia 09/11/2020   Alcohol abuse 09/22/2018   Psoriasis 09/22/2018   Bipolar 1 disorder (HCC) 04/17/2015   GAD (generalized anxiety disorder) 04/17/2015    Past Surgical History:  Procedure Laterality Date   lymph node removal     chain of 9, beign     OB History   No obstetric history on file.      Home Medications    Prior to Admission medications   Medication Sig Start Date End Date Taking? Authorizing Provider  gabapentin (NEURONTIN) 300 MG capsule TAKE 1 CAPSULE(300 MG) BY MOUTH THREE TIMES DAILY 02/12/22  Yes Larae Grooms, NP  ketorolac (TORADOL) 10 MG tablet Take 1 tablet (10 mg total) by mouth every 6 (six) hours as needed. 09/04/22  Yes Becky Augusta, NP  naltrexone (DEPADE) 50 MG tablet Take 1 tablet (50 mg total) by mouth  daily. 08/12/22  Yes Mecum, Tanisa E, PA-C  cetirizine (ZYRTEC) 10 MG tablet TAKE 1 TABLET(10 MG) BY MOUTH DAILY AS NEEDED FOR ALLERGIES Patient not taking: Reported on 08/12/2022 10/23/21   Larae Grooms, NP  cholecalciferol (VITAMIN D3) 25 MCG (1000 UNIT) tablet Take 1,000 Units by mouth daily.    [provider]    Family History Family History  Problem Relation Age of Onset   Fibromyalgia Mother    Heart disease Mother    Hypertension Mother    COPD Mother    Heart attack Mother    Cancer Maternal Grandmother        breast and ovarian   Heart disease Maternal Grandmother    Hypertension Maternal Grandmother    Osteopenia Paternal Grandmother     Social History Social History   Tobacco Use   Smoking status: Former    Current packs/day: 0.00    Types: Cigarettes    Quit date: 2011    Years since quitting: 13.6   Smokeless tobacco: Never  Vaping Use   Vaping status: Never Used  Substance Use Topics   Alcohol use: Yes    Comment: social   Drug use: Never     Allergies   Azithromycin and Penicillins   Review of Systems Review of Systems  Respiratory:  Negative for shortness of breath.   Cardiovascular:  Positive for chest pain.  Skin:  Negative for color change and wound.     Physical Exam Triage Vital Signs ED Triage Vitals  Encounter Vitals Group     BP 09/04/22 1023 130/89     Systolic BP Percentile --      Diastolic BP Percentile --      Pulse Rate 09/04/22 1023 62     Resp 09/04/22 1023 16     Temp 09/04/22 1023 98.5 F (36.9 C)     Temp Source 09/04/22 1023 Oral     SpO2 09/04/22 1023 100 %     Weight 09/04/22 1021 115 lb (52.2 kg)     Height --      Head Circumference --      Peak Flow --      Pain Score 09/04/22 1021 7     Pain Loc --      Pain Education --      Exclude from Growth Chart --    No data found.  Updated Vital Signs BP 130/89 (BP Location: Right Arm)   Pulse 62   Temp 98.5 F (36.9 C) (Oral)   Resp 16   Wt  115 lb (52.2 kg)   SpO2 100%   BMI 18.85 kg/m   Visual Acuity Right Eye Distance:   Left Eye Distance:   Bilateral Distance:    Right Eye Near:   Left Eye Near:    Bilateral Near:     Physical Exam Vitals and nursing note reviewed.  Constitutional:      Appearance: Normal appearance. She is not ill-appearing.  HENT:     Head: Normocephalic and atraumatic.  Cardiovascular:     Rate and Rhythm: Normal rate and regular rhythm.     Pulses: Normal pulses.     Heart sounds: Normal heart sounds. No murmur heard.    No friction rub. No gallop.  Pulmonary:     Effort: Pulmonary effort is normal.     Breath sounds: Normal breath sounds. No wheezing, rhonchi or rales.  Chest:     Chest wall: Tenderness present.  Skin:    General: Skin is warm and dry.     Capillary Refill: Capillary refill takes less than 2 seconds.     Findings: No bruising.  Neurological:     General: No focal deficit present.     Mental Status: She is alert and oriented to person, place, and time.      UC Treatments / Results  Labs (all labs ordered are listed, but only abnormal results are displayed) Labs Reviewed - No data to display  EKG   Radiology No results found.  Procedures Procedures (including critical care time)  Medications Ordered in UC Medications  ketorolac (TORADOL) 30 MG/ML injection 30 mg (has no administration in time range)    Initial Impression / Assessment and Plan / UC Course  I have reviewed the triage vital signs and the nursing notes.  Pertinent labs & imaging results that were available during my care of the patient were reviewed by me and considered in my medical decision making (see chart for details).   Patient is a nontoxic-appearing 37 year old female presenting for evaluation of left-sided rib pain underneath her breast after suffering a ground-level fall yesterday.  She fell while carrying boxes and states that she landed on the boxes and they pressed into her  rib cage.  On exam patient is tender to palpation to her mid and lower left chest wall.  There is no appreciable ecchymosis  or abrasion on the chest wall to visual inspection.  Cardiopulmonary exam reveals clear lung sounds in all fields.  She reports that she is having a significant amount of pain, especially with coughing, breathing, or movement.  She last took pain medication at 0200.  I will have staff administer 30 mg of IM Toradol and obtain a left rib series to evaluate for any rib fractures.  Patient does not have any appreciable crepitus with palpation of the chest wall.  Left rib series independently reviewed and evaluated by me.  Impression: There is no evidence of rib fracture or pneumothorax.  Radiology overread is pending.  I will discharge patient home with a diagnosis of contusion of her left chest wall and have her use over-the-counter lidocaine patches along with Toradol 10 mg every 6 hours as needed.  Work note provided.   Final Clinical Impressions(s) / UC Diagnoses   Final diagnoses:  Chest wall contusion, left, initial encounter     Discharge Instructions      Your x-rays did not show any evidence of rib fractures.  Given your mechanism of injury and physical exam I do believe you have bruised your chest wall.  This can still be significantly painful.  You may apply topical, over-the-counter lidocaine patches every 8 hours as needed for pain.  Supplement this with the Toradol every 6 hours.  Take this with food.  Do not take it for more than 5 days.  You may also apply ice or moist heat to your chest wall to aid in pain relief.  Make sure you are taking 10 deep breaths an hour to help keep your lung inflated so you do not develop pneumonia is resolved and breathing shallow.  If your pain does not improve, or worsens, I recommend following up with your primary care provider or seek care in the ER.     ED Prescriptions     Medication Sig Dispense Auth. Provider    ketorolac (TORADOL) 10 MG tablet Take 1 tablet (10 mg total) by mouth every 6 (six) hours as needed. 20 tablet Becky Augusta, NP      I have reviewed the PDMP during this encounter.   Becky Augusta, NP 09/04/22 1126

## 2022-09-04 NOTE — Discharge Instructions (Addendum)
Your x-rays did not show any evidence of rib fractures.  Given your mechanism of injury and physical exam I do believe you have bruised your chest wall.  This can still be significantly painful.  You may apply topical, over-the-counter lidocaine patches every 8 hours as needed for pain.  Supplement this with the Toradol every 6 hours.  Take this with food.  Do not take it for more than 5 days.  You may also apply ice or moist heat to your chest wall to aid in pain relief.  Make sure you are taking 10 deep breaths an hour to help keep your lung inflated so you do not develop pneumonia is resolved and breathing shallow.  If your pain does not improve, or worsens, I recommend following up with your primary care provider or seek care in the ER.

## 2022-09-04 NOTE — ED Triage Notes (Signed)
Fall yesterday patient is having left side chest pain stabbing pain in lung area. Abrasions on left side of body. Left rib pain.

## 2022-10-21 DIAGNOSIS — D225 Melanocytic nevi of trunk: Secondary | ICD-10-CM | POA: Diagnosis not present

## 2022-10-21 DIAGNOSIS — L400 Psoriasis vulgaris: Secondary | ICD-10-CM | POA: Diagnosis not present

## 2022-10-21 DIAGNOSIS — D485 Neoplasm of uncertain behavior of skin: Secondary | ICD-10-CM | POA: Diagnosis not present

## 2022-10-21 DIAGNOSIS — Z79899 Other long term (current) drug therapy: Secondary | ICD-10-CM | POA: Diagnosis not present

## 2022-10-21 DIAGNOSIS — L578 Other skin changes due to chronic exposure to nonionizing radiation: Secondary | ICD-10-CM | POA: Diagnosis not present

## 2022-11-08 DIAGNOSIS — D225 Melanocytic nevi of trunk: Secondary | ICD-10-CM | POA: Diagnosis not present

## 2022-11-08 DIAGNOSIS — D235 Other benign neoplasm of skin of trunk: Secondary | ICD-10-CM | POA: Diagnosis not present

## 2022-11-12 ENCOUNTER — Ambulatory Visit (INDEPENDENT_AMBULATORY_CARE_PROVIDER_SITE_OTHER): Payer: BC Managed Care – PPO | Admitting: Nurse Practitioner

## 2022-11-12 VITALS — BP 105/74 | HR 98 | Temp 98.6°F | Ht 65.0 in | Wt 113.2 lb

## 2022-11-12 DIAGNOSIS — E7800 Pure hypercholesterolemia, unspecified: Secondary | ICD-10-CM | POA: Diagnosis not present

## 2022-11-12 DIAGNOSIS — E559 Vitamin D deficiency, unspecified: Secondary | ICD-10-CM

## 2022-11-12 DIAGNOSIS — F1020 Alcohol dependence, uncomplicated: Secondary | ICD-10-CM | POA: Diagnosis not present

## 2022-11-12 DIAGNOSIS — R634 Abnormal weight loss: Secondary | ICD-10-CM

## 2022-11-12 DIAGNOSIS — F319 Bipolar disorder, unspecified: Secondary | ICD-10-CM | POA: Diagnosis not present

## 2022-11-12 NOTE — Assessment & Plan Note (Signed)
Labs ordered at visit today.  Will make recommendations based on lab results.   

## 2022-11-12 NOTE — Assessment & Plan Note (Signed)
Chronic. Does feel like her mood is more exacerbated at this time. Agrees to seeing Psychiatry. Information given for [facility] during visit today.

## 2022-11-12 NOTE — Assessment & Plan Note (Addendum)
Chronic.  Controlled.  Doing well with Naltrexone.  Has been sober for 286 days.  CMP, B1 and CBC ordered today.  Follow up in 2 months for weight management.  Patient has lost 5lbs since last visit.  Call sooner if concerns arise.

## 2022-11-12 NOTE — Progress Notes (Signed)
BP 105/74 (BP Location: Left Arm, Patient Position: Sitting, Cuff Size: Normal)   Pulse 98   Temp 98.6 F (37 C) (Oral)   Ht 5\' 5"  (1.651 m)   Wt 113 lb 3.2 oz (51.3 kg)   SpO2 100%   BMI 18.84 kg/m    Subjective:    Patient ID: Hailey Castaneda, female    DOB: [birth year], 37 y.o.   MRN: [id number]  HPI: Hailey Castaneda is a 37 y.o. female  Chief Complaint  Patient presents with   Depression   Alcohol Use Disorder   Has been using Naltrexone- feels like it is working well.  She is still 287 days sober.  She feels like she would like to continue it.  She went a week without and felt like she having more cravings for alcohol.    She reports she is almost 7 months sober and would like to check vitamin, liver and electrolytes  She feels like she is really only hungry 1 week a month.  Nothing sounds appetizing and she isn't hungry.  Her sister does cook for her a couple of times a week.  MOOD Feels like she is doing well.  She does feel like she has been having some manic episode.  She agrees that seeing psychiatrist would be beneficial.    Relevant past medical, surgical, family and social history reviewed and updated as indicated. Interim medical history since our last visit reviewed. Allergies and medications reviewed and updated.  Review of Systems  Psychiatric/Behavioral:  Positive for dysphoric mood. Negative for suicidal ideas. The patient is nervous/anxious.        Some mania    Per HPI unless specifically indicated above     Objective:    BP 105/74 (BP Location: Left Arm, Patient Position: Sitting, Cuff Size: Normal)   Pulse 98   Temp 98.6 F (37 C) (Oral)   Ht 5\' 5"  (1.651 m)   Wt 113 lb 3.2 oz (51.3 kg)   SpO2 100%   BMI 18.84 kg/m   Wt Readings from Last 3 Encounters:  11/12/22 113 lb 3.2 oz (51.3 kg)  09/04/22 115 lb (52.2 kg)  08/12/22 118 lb 3.2 oz (53.6 kg)    Physical Exam Vitals and nursing note reviewed.  Constitutional:      General: She is  not in acute distress.    Appearance: Normal appearance. She is not ill-appearing, toxic-appearing or diaphoretic.  HENT:     Head: Normocephalic.     Right Ear: External ear normal.     Left Ear: External ear normal.     Nose: Nose normal.     Mouth/Throat:     Mouth: Mucous membranes are moist.     Pharynx: Oropharynx is clear.  Eyes:     General:        Right eye: No discharge.        Left eye: No discharge.     Extraocular Movements: Extraocular movements intact.     Conjunctiva/sclera: Conjunctivae normal.     Pupils: Pupils are equal, round, and reactive to light.  Cardiovascular:     Rate and Rhythm: Normal rate and regular rhythm.     Heart sounds: No murmur heard. Pulmonary:     Effort: Pulmonary effort is normal. No respiratory distress.     Breath sounds: Normal breath sounds. No wheezing or rales.  Musculoskeletal:     Cervical back: Normal range of motion and neck supple.  Skin:  General: Skin is warm and dry.     Capillary Refill: Capillary refill takes less than 2 seconds.  Neurological:     General: No focal deficit present.     Mental Status: She is alert and oriented to person, place, and time. Mental status is at baseline.  Psychiatric:        Mood and Affect: Mood normal.        Behavior: Behavior normal.        Thought Content: Thought content normal.        Judgment: Judgment normal.     Results for orders placed or performed in visit on 08/12/22  Comp Met (CMET)  Result Value Ref Range   Glucose 94 70 - 99 mg/dL   BUN 9 6 - 20 mg/dL   Creatinine, Ser 1.61 0.57 - 1.00 mg/dL   eGFR 096 >04 VW/UJW/1.19   BUN/Creatinine Ratio 12 9 - 23   Sodium 140 134 - 144 mmol/L   Potassium 4.1 3.5 - 5.2 mmol/L   Chloride 105 96 - 106 mmol/L   CO2 21 20 - 29 mmol/L   Calcium 9.7 8.7 - 10.2 mg/dL   Total Protein 6.7 6.0 - 8.5 g/dL   Albumin 4.1 3.9 - 4.9 g/dL   Globulin, Total 2.6 1.5 - 4.5 g/dL   Bilirubin Total 0.3 0.0 - 1.2 mg/dL   Alkaline Phosphatase  71 44 - 121 IU/L   AST 19 0 - 40 IU/L   ALT 15 0 - 32 IU/L  CBC w/Diff  Result Value Ref Range   WBC 9.1 3.4 - 10.8 x10E3/uL   RBC 4.93 3.77 - 5.28 x10E6/uL   Hemoglobin 13.7 11.1 - 15.9 g/dL   Hematocrit 14.7 82.9 - 46.6 %   MCV 85 79 - 97 fL   MCH 27.8 26.6 - 33.0 pg   MCHC 32.9 31.5 - 35.7 g/dL   RDW 56.2 13.0 - 86.5 %   Platelets 218 150 - 450 x10E3/uL   Neutrophils 54 Not Estab. %   Lymphs 32 Not Estab. %   Monocytes 10 Not Estab. %   Eos 3 Not Estab. %   Basos 1 Not Estab. %   Neutrophils Absolute 4.9 1.4 - 7.0 x10E3/uL   Lymphocytes Absolute 2.9 0.7 - 3.1 x10E3/uL   Monocytes Absolute 0.9 0.1 - 0.9 x10E3/uL   EOS (ABSOLUTE) 0.3 0.0 - 0.4 x10E3/uL   Basophils Absolute 0.1 0.0 - 0.2 x10E3/uL   Immature Granulocytes 0 Not Estab. %   Immature Grans (Abs) 0.0 0.0 - 0.1 x10E3/uL  B12  Result Value Ref Range   Vitamin B-12 381 232 - 1,245 pg/mL  Vitamin B1  Result Value Ref Range   Thiamine 105.3 66.5 - 200.0 nmol/L      Assessment & Plan:   Problem List Items Addressed This Visit       Other   Bipolar 1 disorder (HCC) - Primary    Chronic. Does feel like her mood is more exacerbated at this time. Agrees to seeing Psychiatry. Information given for [facility] during visit today.        Relevant Orders   Comp Met (CMET)   Alcohol use disorder, moderate, dependence (HCC)    Chronic.  Controlled.  Doing well with Naltrexone.  Has been sober for 286 days.  CMP, B1 and CBC ordered today.  Follow up in 2 months for weight management.  Patient has lost 5lbs since last visit.  Call sooner if concerns arise.  Relevant Orders   B12   Vitamin B1   CBC w/Diff   Hypercholesteremia    Labs ordered at visit today.  Will make recommendations based on lab results.        Relevant Orders   Lipid Profile   Other Visit Diagnoses     Vitamin D deficiency       Labs ordered at visit today.   Relevant Orders   Vitamin D (25 hydroxy)   Weight loss        Lengthy discussion had with patient regarding eating habits and weight loss. Encouraged patient to increase protein intake. Declines referral to Nutritionist.        Follow up plan: Return in about 3 months (around 02/12/2023) for Medication Management.

## 2022-11-16 LAB — CBC WITH DIFFERENTIAL/PLATELET
Basophils Absolute: 0.1 10*3/uL (ref 0.0–0.2)
Basos: 1 %
EOS (ABSOLUTE): 0.2 10*3/uL (ref 0.0–0.4)
Eos: 2 %
Hematocrit: 39.8 % (ref 34.0–46.6)
Hemoglobin: 12.8 g/dL (ref 11.1–15.9)
Immature Grans (Abs): 0 10*3/uL (ref 0.0–0.1)
Immature Granulocytes: 0 %
Lymphocytes Absolute: 2.6 10*3/uL (ref 0.7–3.1)
Lymphs: 29 %
MCH: 28.9 pg (ref 26.6–33.0)
MCHC: 32.2 g/dL (ref 31.5–35.7)
MCV: 90 fL (ref 79–97)
Monocytes Absolute: 0.7 10*3/uL (ref 0.1–0.9)
Monocytes: 8 %
Neutrophils Absolute: 5.4 10*3/uL (ref 1.4–7.0)
Neutrophils: 60 %
Platelets: 241 10*3/uL (ref 150–450)
RBC: 4.43 x10E6/uL (ref 3.77–5.28)
RDW: 13.5 % (ref 11.7–15.4)
WBC: 9 10*3/uL (ref 3.4–10.8)

## 2022-11-16 LAB — COMPREHENSIVE METABOLIC PANEL
ALT: 13 [IU]/L (ref 0–32)
AST: 18 [IU]/L (ref 0–40)
Albumin: 3.9 g/dL (ref 3.9–4.9)
Alkaline Phosphatase: 59 [IU]/L (ref 44–121)
BUN/Creatinine Ratio: 7 — ABNORMAL LOW (ref 9–23)
BUN: 5 mg/dL — ABNORMAL LOW (ref 6–20)
Bilirubin Total: 0.3 mg/dL (ref 0.0–1.2)
CO2: 23 mmol/L (ref 20–29)
Calcium: 9.2 mg/dL (ref 8.7–10.2)
Chloride: 103 mmol/L (ref 96–106)
Creatinine, Ser: 0.75 mg/dL (ref 0.57–1.00)
Globulin, Total: 2.8 g/dL (ref 1.5–4.5)
Glucose: 100 mg/dL — ABNORMAL HIGH (ref 70–99)
Potassium: 3.9 mmol/L (ref 3.5–5.2)
Sodium: 142 mmol/L (ref 134–144)
Total Protein: 6.7 g/dL (ref 6.0–8.5)
eGFR: 105 mL/min/{1.73_m2} (ref >59)

## 2022-11-16 LAB — LIPID PANEL
Chol/HDL Ratio: 3.2 ratio (ref 0.0–4.4)
Cholesterol, Total: 200 mg/dL — ABNORMAL HIGH (ref 100–199)
HDL: 62 mg/dL (ref >39)
LDL Chol Calc (NIH): 120 mg/dL — ABNORMAL HIGH (ref 0–99)
Triglycerides: 100 mg/dL (ref 0–149)
VLDL Cholesterol Cal: 18 mg/dL (ref 5–40)

## 2022-11-16 LAB — VITAMIN B12: Vitamin B-12: 314 pg/mL (ref 232–1245)

## 2022-11-16 LAB — VITAMIN B1: Thiamine: 88.1 nmol/L (ref 66.5–200.0)

## 2022-11-16 LAB — VITAMIN D 25 HYDROXY (VIT D DEFICIENCY, FRACTURES): Vit D, 25-Hydroxy: 35 ng/mL (ref 30.0–100.0)

## 2023-02-12 ENCOUNTER — Ambulatory Visit (INDEPENDENT_AMBULATORY_CARE_PROVIDER_SITE_OTHER): Payer: BC Managed Care – PPO | Admitting: Nurse Practitioner

## 2023-02-12 DIAGNOSIS — F1020 Alcohol dependence, uncomplicated: Secondary | ICD-10-CM

## 2023-02-12 MED ORDER — NALTREXONE HCL 50 MG PO TABS: 50.0000 mg | ORAL_TABLET | Freq: Every day | ORAL | 1 refills | Status: DC

## 2023-02-12 MED ORDER — NALTREXONE HCL 50 MG PO TABS: 50.0000 mg | ORAL_TABLET | Freq: Every day | ORAL | 1 refills | Status: AC

## 2023-02-12 NOTE — Assessment & Plan Note (Signed)
In Remission.  380 days sober.  No longer taking the Naltrexone daily.  Refilled incase patient needs the medication.  Follow up in 6 months.  Call sooner if concerns arise.

## 2023-02-12 NOTE — Progress Notes (Signed)
BP 134/88 (BP Location: Left Arm, Patient Position: Sitting, Cuff Size: Large)   Pulse 73   Wt 122 lb 12.8 oz (55.7 kg)   SpO2 100%   BMI 20.43 kg/m    Subjective:    Patient ID: Hailey Castaneda, female    DOB: 11-05-1985, 38 y.o.   MRN: [id number]  HPI: Hailey Castaneda is a 38 y.o. female  Chief Complaint  Patient presents with   Medication Management   Alcohol Use Disorder  Patient states she is doing really well.  No longer taking the Naltrexone.  Does not have the urge to drink.  Her appetite has come back since stopping the medication.  She is 380 days without alcohol.       Relevant past medical, surgical, family and social history reviewed and updated as indicated. Interim medical history since our last visit reviewed. Allergies and medications reviewed and updated.  Review of Systems  All other systems reviewed and are negative.   Per HPI unless specifically indicated above     Objective:    BP 134/88 (BP Location: Left Arm, Patient Position: Sitting, Cuff Size: Large)   Pulse 73   Wt 122 lb 12.8 oz (55.7 kg)   SpO2 100%   BMI 20.43 kg/m   Wt Readings from Last 3 Encounters:  02/12/23 122 lb 12.8 oz (55.7 kg)  11/12/22 113 lb 3.2 oz (51.3 kg)  09/04/22 115 lb (52.2 kg)    Physical Exam Vitals and nursing note reviewed.  Constitutional:      General: She is not in acute distress.    Appearance: Normal appearance. She is not ill-appearing, toxic-appearing or diaphoretic.  HENT:     Head: Normocephalic.     Right Ear: External ear normal.     Left Ear: External ear normal.     Nose: Nose normal.     Mouth/Throat:     Mouth: Mucous membranes are moist.     Pharynx: Oropharynx is clear.  Eyes:     General:        Right eye: No discharge.        Left eye: No discharge.     Extraocular Movements: Extraocular movements intact.     Conjunctiva/sclera: Conjunctivae normal.     Pupils: Pupils are equal, round, and reactive to light.  Cardiovascular:      Rate and Rhythm: Normal rate and regular rhythm.     Heart sounds: No murmur heard. Pulmonary:     Effort: Pulmonary effort is normal. No respiratory distress.     Breath sounds: Normal breath sounds. No wheezing or rales.  Musculoskeletal:     Cervical back: Normal range of motion and neck supple.  Skin:    General: Skin is warm and dry.     Capillary Refill: Capillary refill takes less than 2 seconds.  Neurological:     General: No focal deficit present.     Mental Status: She is alert and oriented to person, place, and time. Mental status is at baseline.  Psychiatric:        Mood and Affect: Mood normal.        Behavior: Behavior normal.        Thought Content: Thought content normal.        Judgment: Judgment normal.     Results for orders placed or performed in visit on 11/12/22  Comp Met (CMET)   Collection Time: 11/12/22  1:54 PM  Result Value Ref Range   Glucose  100 (H) 70 - 99 mg/dL   BUN 5 (L) 6 - 20 mg/dL   Creatinine, Ser 0.34 0.57 - 1.00 mg/dL   eGFR 742 >59 DG/LOV/5.64   BUN/Creatinine Ratio 7 (L) 9 - 23   Sodium 142 134 - 144 mmol/L   Potassium 3.9 3.5 - 5.2 mmol/L   Chloride 103 96 - 106 mmol/L   CO2 23 20 - 29 mmol/L   Calcium 9.2 8.7 - 10.2 mg/dL   Total Protein 6.7 6.0 - 8.5 g/dL   Albumin 3.9 3.9 - 4.9 g/dL   Globulin, Total 2.8 1.5 - 4.5 g/dL   Bilirubin Total 0.3 0.0 - 1.2 mg/dL   Alkaline Phosphatase 59 44 - 121 IU/L   AST 18 0 - 40 IU/L   ALT 13 0 - 32 IU/L  B12   Collection Time: 11/12/22  1:54 PM  Result Value Ref Range   Vitamin B-12 314 232 - 1,245 pg/mL  Vitamin B1   Collection Time: 11/12/22  1:54 PM  Result Value Ref Range   Thiamine 88.1 66.5 - 200.0 nmol/L  CBC w/Diff   Collection Time: 11/12/22  1:54 PM  Result Value Ref Range   WBC 9.0 3.4 - 10.8 x10E3/uL   RBC 4.43 3.77 - 5.28 x10E6/uL   Hemoglobin 12.8 11.1 - 15.9 g/dL   Hematocrit 33.2 95.1 - 46.6 %   MCV 90 79 - 97 fL   MCH 28.9 26.6 - 33.0 pg   MCHC 32.2 31.5 - 35.7  g/dL   RDW 88.4 16.6 - 06.3 %   Platelets 241 150 - 450 x10E3/uL   Neutrophils 60 Not Estab. %   Lymphs 29 Not Estab. %   Monocytes 8 Not Estab. %   Eos 2 Not Estab. %   Basos 1 Not Estab. %   Neutrophils Absolute 5.4 1.4 - 7.0 x10E3/uL   Lymphocytes Absolute 2.6 0.7 - 3.1 x10E3/uL   Monocytes Absolute 0.7 0.1 - 0.9 x10E3/uL   EOS (ABSOLUTE) 0.2 0.0 - 0.4 x10E3/uL   Basophils Absolute 0.1 0.0 - 0.2 x10E3/uL   Immature Granulocytes 0 Not Estab. %   Immature Grans (Abs) 0.0 0.0 - 0.1 x10E3/uL  Vitamin D (25 hydroxy)   Collection Time: 11/12/22  1:54 PM  Result Value Ref Range   Vit D, 25-Hydroxy 35.0 30.0 - 100.0 ng/mL  Lipid Profile   Collection Time: 11/12/22  1:54 PM  Result Value Ref Range   Cholesterol, Total 200 (H) 100 - 199 mg/dL   Triglycerides 016 0 - 149 mg/dL   HDL 62 >01 mg/dL   VLDL Cholesterol Cal 18 5 - 40 mg/dL   LDL Chol Calc (NIH) 093 (H) 0 - 99 mg/dL   Chol/HDL Ratio 3.2 0.0 - 4.4 ratio      Assessment & Plan:   Problem List Items Addressed This Visit   None     Follow up plan: No follow-ups on file.

## 2023-03-26 DIAGNOSIS — Z111 Encounter for screening for respiratory tuberculosis: Secondary | ICD-10-CM | POA: Diagnosis not present

## 2023-03-26 DIAGNOSIS — L400 Psoriasis vulgaris: Secondary | ICD-10-CM | POA: Diagnosis not present

## 2023-03-26 DIAGNOSIS — Z79899 Other long term (current) drug therapy: Secondary | ICD-10-CM | POA: Diagnosis not present

## 2023-05-17 IMAGING — CT CT ABD-PELV W/O CM
2 of 4 series · 17 of 46 positions shown, 19 images · non-contrast
Comparison: None.

CLINICAL DATA: Abdominal pain

EXAM:
CT ABDOMEN AND PELVIS WITHOUT CONTRAST
TECHNIQUE: Multidetector CT imaging of the abdomen and pelvis was performed
following the standard protocol without IV contrast.

[Series 4: axial st · axial · 0.71mm/px · z∈[-679,-239]mm · 14 of 98 slices shown, 16 images]
[im 5/98  soft-tissue]
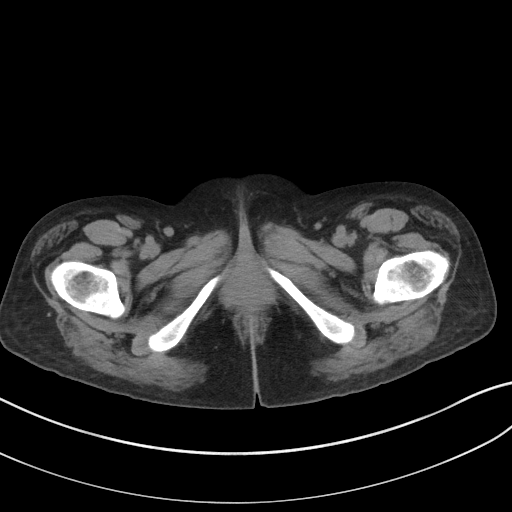
[im 5/98  bone]
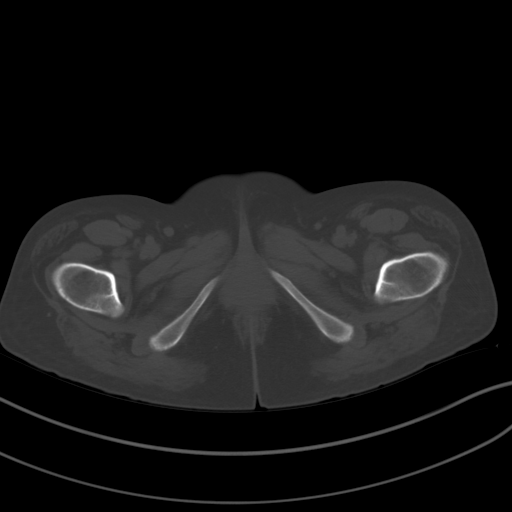
[im 13/98  soft-tissue]
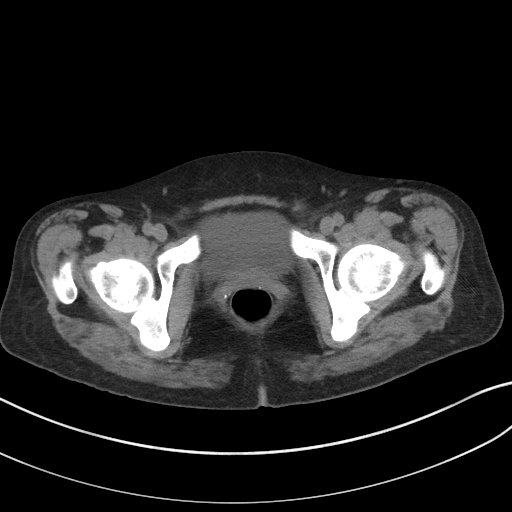
[im 21/98  soft-tissue]
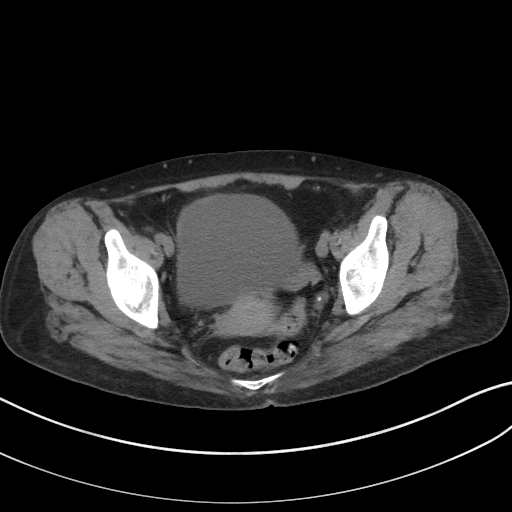
[im 25/98  soft-tissue]
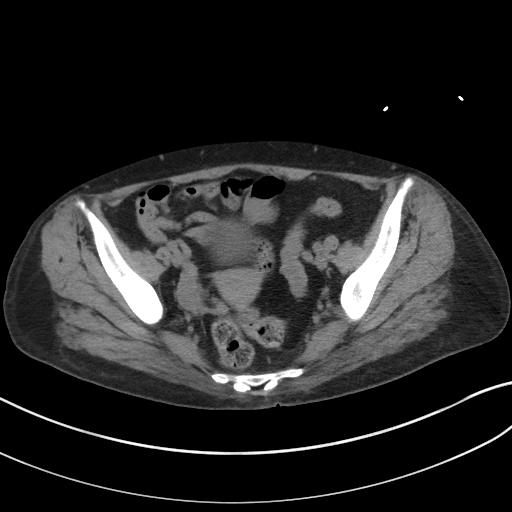
[im 33/98  soft-tissue]
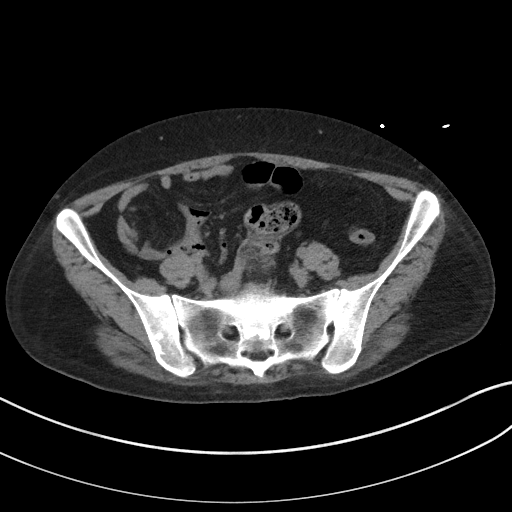
[im 41/98  soft-tissue]
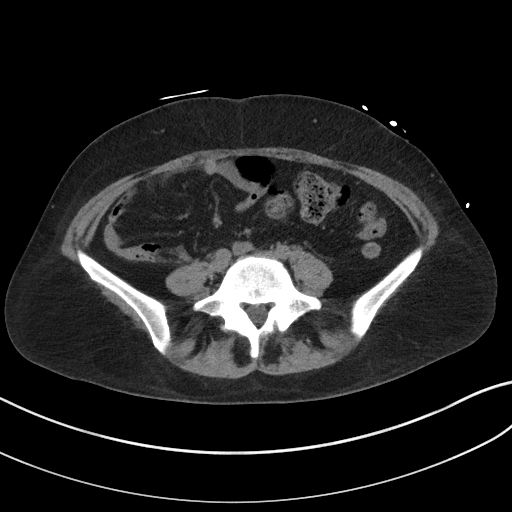
[im 45/98  soft-tissue]
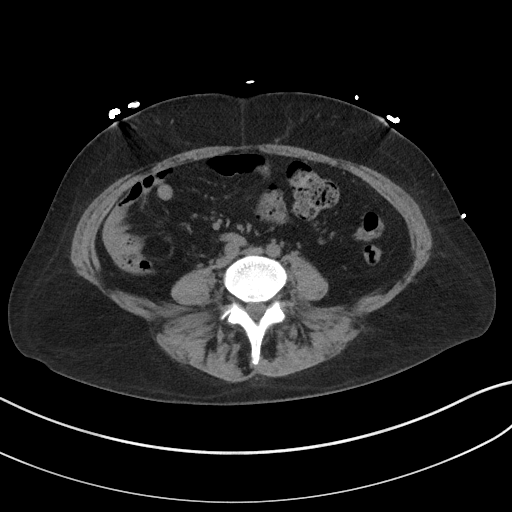
[im 53/98  soft-tissue]
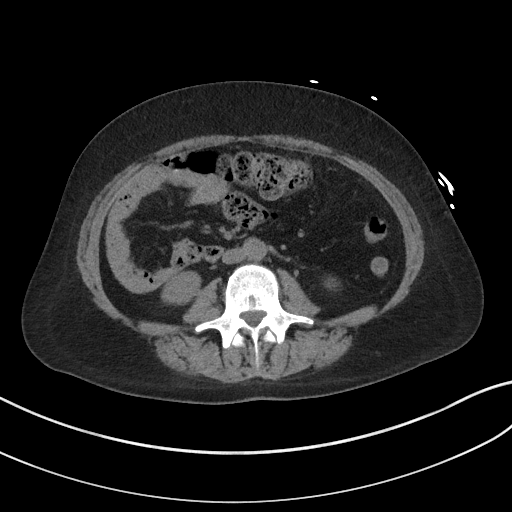
[im 57/98  soft-tissue]
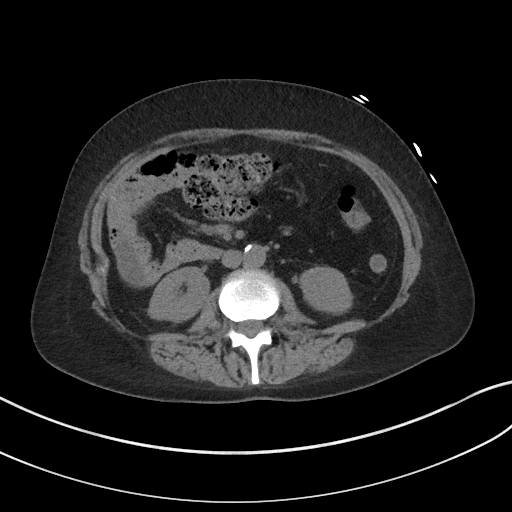
[im 57/98  bone]
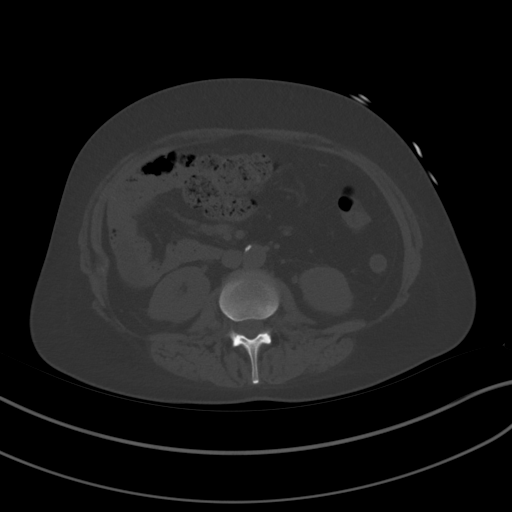
[im 65/98  soft-tissue]
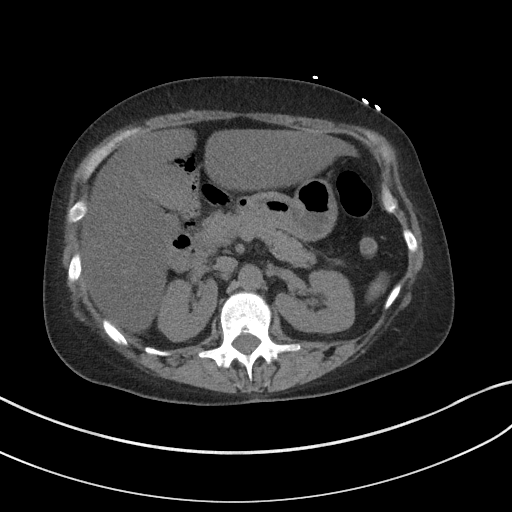
[im 73/98  soft-tissue]
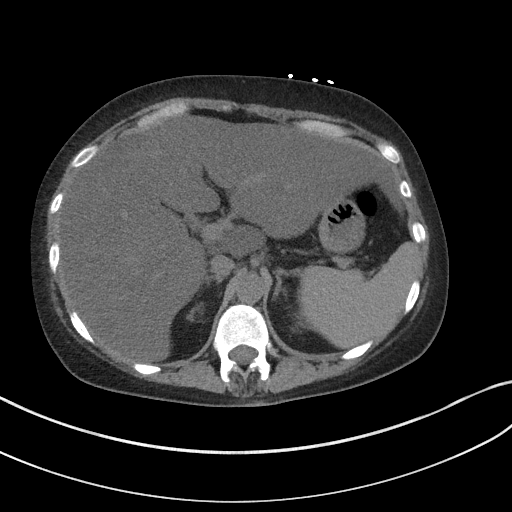
[im 77/98  soft-tissue]
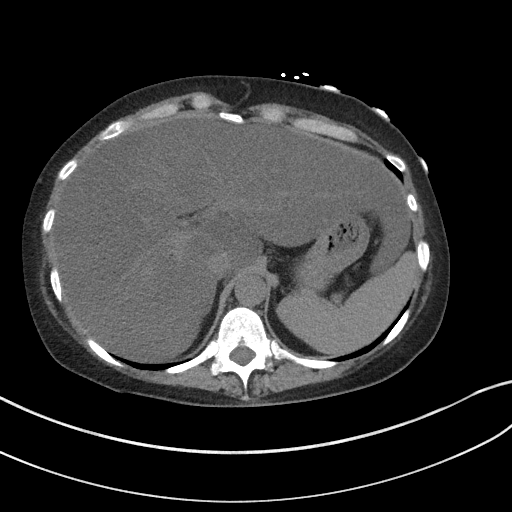
[im 85/98  soft-tissue]
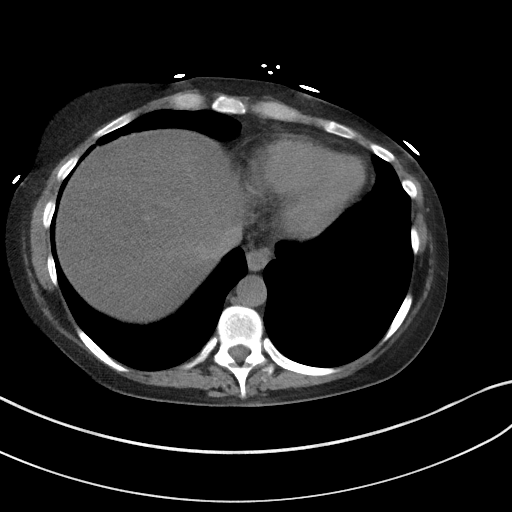
[im 93/98  soft-tissue]
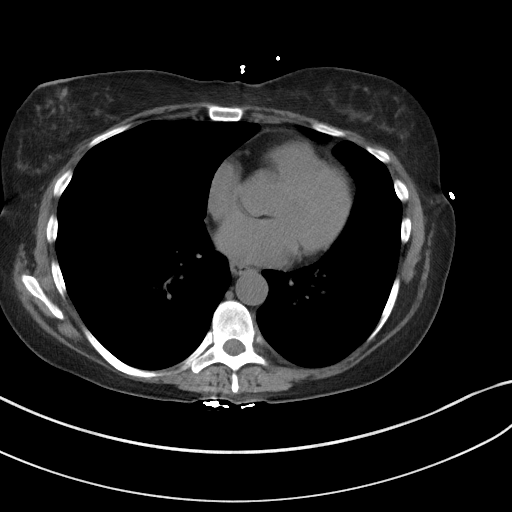

[Series 5: coronal st · coronal · 0.84mm/px · 3 of 101 slices shown]
[im 34/101  soft-tissue]
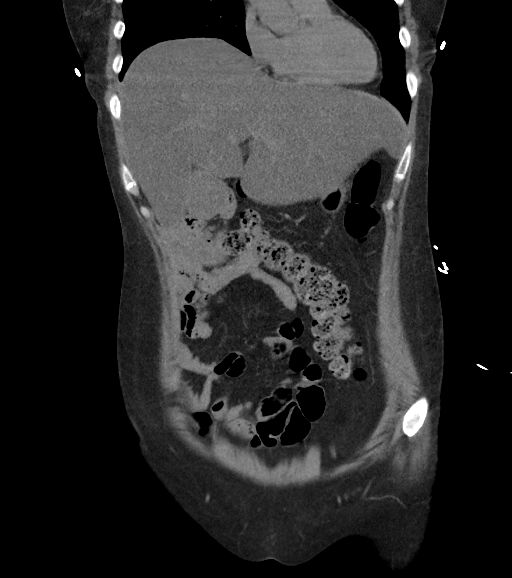
[im 45/101  soft-tissue]
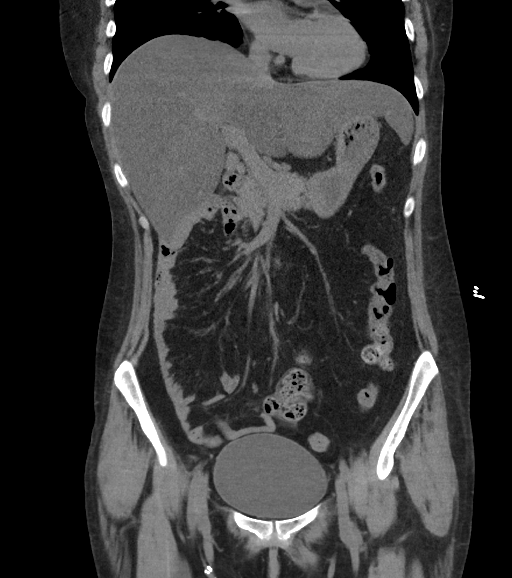
[im 56/101  soft-tissue]
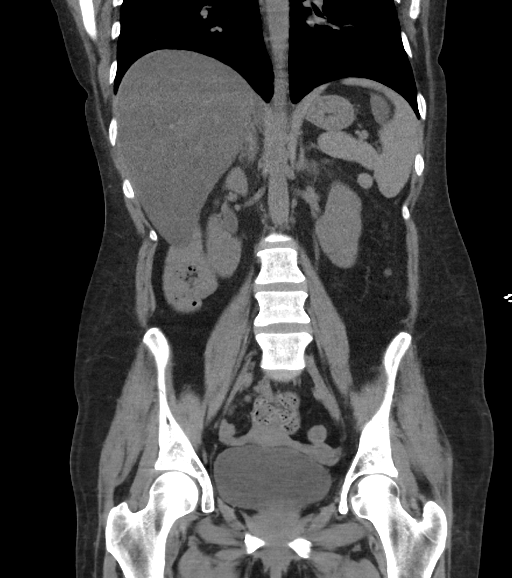

[17 of 46 positions shown; findings below may reference images not displayed]

FINDINGS: Lower chest: Lung bases are clear. No effusions. Heart is normal
size.

Hepatobiliary: Diffuse low-density throughout the liver compatible
with fatty infiltration. No focal abnormality. Gallbladder
unremarkable.

Pancreas: No focal abnormality or ductal dilatation.

Spleen: No focal abnormality.  Normal size.

Adrenals/Urinary Tract: No adrenal abnormality. No focal renal
abnormality. No stones or hydronephrosis. Urinary bladder is
unremarkable.

Stomach/Bowel: Stomach, large and small bowel grossly unremarkable.
Normal appendix.

Vascular/Lymphatic: No evidence of aneurysm or adenopathy.

Reproductive: Uterus and adnexa unremarkable.  No mass.

Other: No free fluid or free air.

Musculoskeletal: No acute bony abnormality.
IMPRESSION: Hepatic steatosis.

No acute findings in the abdomen or pelvis.

## 2023-08-14 ENCOUNTER — Ambulatory Visit (INDEPENDENT_AMBULATORY_CARE_PROVIDER_SITE_OTHER): Payer: Self-pay | Admitting: Nurse Practitioner

## 2023-08-14 VITALS — BP 132/84 | HR 99 | Temp 98.3°F | Ht 65.0 in | Wt 131.0 lb

## 2023-08-14 DIAGNOSIS — F1010 Alcohol abuse, uncomplicated: Secondary | ICD-10-CM | POA: Diagnosis not present

## 2023-08-14 DIAGNOSIS — F319 Bipolar disorder, unspecified: Secondary | ICD-10-CM | POA: Diagnosis not present

## 2023-08-14 DIAGNOSIS — Z0000 Encounter for general adult medical examination without abnormal findings: Secondary | ICD-10-CM

## 2023-08-14 DIAGNOSIS — F411 Generalized anxiety disorder: Secondary | ICD-10-CM | POA: Diagnosis not present

## 2023-08-14 DIAGNOSIS — E7800 Pure hypercholesterolemia, unspecified: Secondary | ICD-10-CM

## 2023-08-14 NOTE — Assessment & Plan Note (Signed)
 Chronic. Controlled.  Drinking 1x per week and feels like she is managing it well.

## 2023-08-14 NOTE — Assessment & Plan Note (Signed)
 Chronic. Ongoing concern.  Not sure if she wants to start medication at this time.  She will follow up if she decides to start medication.  Otherwise, follow up in 6 months.

## 2023-08-14 NOTE — Progress Notes (Signed)
 BP 132/84   Pulse 99   Temp 98.3 F (36.8 C) (Oral)   Ht 5' 5 (1.651 m)   Wt 131 lb (59.4 kg)   LMP 07/23/2023 (Exact Date)   SpO2 100%   BMI 21.80 kg/m    Subjective:    Patient ID: Hailey Castaneda, female    DOB: 15-Sep-1985, 38 y.o.   MRN: [id number]  HPI: HAEDYN BREAU is a 38 y.o. female presenting on 08/14/2023 for comprehensive medical examination. Current medical complaints include: none  She currently lives with: Menopausal Symptoms: no  MOOD Patient has not been doing great.  She feels like her marriage isn't going well but isn't sure how to leave.  She has started drinking again but allows herself to drink 1x per week.  Never thought she could be a casual drinker but has been managing well over the last 3 months.  She did stop the naltrexone  due to weight loss.  Does still have the prescription in case she needs it.   Depression Screen done today and results listed below:     08/14/2023    8:47 AM 08/12/2022    2:30 PM 03/14/2022    9:37 AM 02/11/2022    9:30 AM 06/21/2020    1:49 PM  Depression screen PHQ 2/9  Decreased Interest 1 1 0 1 0  Down, Depressed, Hopeless 1 1 0 1 0  PHQ - 2 Score 2 2 0 2 0  Altered sleeping 0 1 1 3    Tired, decreased energy 0 1 1 0   Change in appetite 0 3 1 0   Feeling bad or failure about yourself  0 2 0 1   Trouble concentrating 0 0 0 0   Moving slowly or fidgety/restless 0 0 0 0   Suicidal thoughts 0 0 0 0   PHQ-9 Score 2 9 3 6    Difficult doing work/chores Not difficult at all Somewhat difficult Not difficult at all Somewhat difficult     The patient does not have a history of falls. I did complete a risk assessment for falls. A plan of care for falls was documented.   Past Medical History:  Past Medical History:  Diagnosis Date   Allergy    Anemia    Anxiety    Bipolar 1 disorder (HCC)    Hypertension    Sinus disorder    Substance abuse (HCC)     Surgical History:  Past Surgical History:  Procedure Laterality Date    lymph node removal     chain of 9, beign     Medications:  Current Outpatient Medications on File Prior to Visit  Medication Sig   cetirizine  (ZYRTEC ) 10 MG tablet TAKE 1 TABLET(10 MG) BY MOUTH DAILY AS NEEDED FOR ALLERGIES   naltrexone  (DEPADE) 50 MG tablet Take 1 tablet (50 mg total) by mouth daily. (Patient not taking: Reported on 08/14/2023)   No current facility-administered medications on file prior to visit.    Allergies:  Allergies  Allergen Reactions   Azithromycin Other (See Comments)   Penicillins Other (See Comments)    Social History:  Social History   Socioeconomic History   Marital status: Married    Spouse name: Not on file   Number of children: Not on file   Years of education: Not on file   Highest education level: Some college, no degree  Occupational History   Not on file  Tobacco Use   Smoking status: Former  Current packs/day: 0.00    Types: Cigarettes    Quit date: 2011    Years since quitting: 14.5   Smokeless tobacco: Never  Vaping Use   Vaping status: Never Used  Substance and Sexual Activity   Alcohol use: Yes    Comment: social   Drug use: Never   Sexual activity: Yes    Birth control/protection: None  Other Topics Concern   Not on file  Social History Narrative   Not on file   Social Drivers of Health   Financial Resource Strain: Low Risk  (08/14/2023)   Overall Financial Resource Strain (CARDIA)    Difficulty of Paying Living Expenses: Not hard at all  Food Insecurity: No Food Insecurity (08/14/2023)   Hunger Vital Sign    Worried About Running Out of Food in the Last Year: Never true    Ran Out of Food in the Last Year: Never true  Transportation Needs: No Transportation Needs (08/14/2023)   PRAPARE - Administrator, Civil Service (Medical): No    Lack of Transportation (Non-Medical): No  Physical Activity: Sufficiently Active (08/14/2023)   Exercise Vital Sign    Days of Exercise per Week: 2 days    Minutes  of Exercise per Session: 90 min  Stress: No Stress Concern Present (08/14/2023)   [employer] of Occupational Health - Occupational Stress Questionnaire    Feeling of Stress: Not at all  Social Connections: Moderately Integrated (08/14/2023)   Social Connection and Isolation Panel    Frequency of Communication with Friends and Family: More than three times a week    Frequency of Social Gatherings with Friends and Family: More than three times a week    Attends Religious Services: 1 to 4 times per year    Active Member of [employer] or Organizations: No    Attends Engineer, structural: Not on file    Marital Status: Married  Catering manager Violence: Not on file   Social History   Tobacco Use  Smoking Status Former   Current packs/day: 0.00   Types: Cigarettes   Quit date: 2011   Years since quitting: 14.5  Smokeless Tobacco Never   Social History   Substance and Sexual Activity  Alcohol Use Yes   Comment: social    Family History:  Family History  Problem Relation Age of Onset   Fibromyalgia Mother    Heart disease Mother    Hypertension Mother    COPD Mother    Heart attack Mother    Anxiety disorder Mother    Asthma Mother    Drug abuse Mother    Early death Mother    Miscarriages / [place] Mother    Alcohol abuse Father    Cancer Maternal Grandmother        breast and ovarian   Heart disease Maternal Grandmother    Hypertension Maternal Grandmother    Arthritis Maternal Grandmother    Kidney disease Maternal Grandmother    Miscarriages / Stillbirths Maternal Grandmother    Stroke Maternal Grandmother    Stroke Maternal Grandfather    Osteopenia Paternal Grandmother    Alcohol abuse Paternal Grandfather    Anxiety disorder Sister     Past medical history, surgical history, medications, allergies, family history and social history reviewed with patient today and changes made to appropriate areas of the chart.   Review of Systems   Psychiatric/Behavioral:  Positive for depression. Negative for suicidal ideas. The patient is nervous/anxious.    All  other ROS negative except what is listed above and in the HPI.      Objective:    BP 132/84   Pulse 99   Temp 98.3 F (36.8 C) (Oral)   Ht 5' 5 (1.651 m)   Wt 131 lb (59.4 kg)   LMP 07/23/2023 (Exact Date)   SpO2 100%   BMI 21.80 kg/m   Wt Readings from Last 3 Encounters:  08/14/23 131 lb (59.4 kg)  02/12/23 122 lb 12.8 oz (55.7 kg)  11/12/22 113 lb 3.2 oz (51.3 kg)    Physical Exam Vitals and nursing note reviewed.  Constitutional:      General: She is awake. She is not in acute distress.    Appearance: Normal appearance. She is well-developed. She is not ill-appearing.  HENT:     Head: Normocephalic and atraumatic.     Right Ear: Hearing, tympanic membrane, ear canal and external ear normal. No drainage.     Left Ear: Hearing, tympanic membrane, ear canal and external ear normal. No drainage.     Nose: Nose normal.     Right Sinus: No maxillary sinus tenderness or frontal sinus tenderness.     Left Sinus: No maxillary sinus tenderness or frontal sinus tenderness.     Mouth/Throat:     Mouth: Mucous membranes are moist.     Pharynx: Oropharynx is clear. Uvula midline. No pharyngeal swelling, oropharyngeal exudate or posterior oropharyngeal erythema.  Eyes:     General: Lids are normal.        Right eye: No discharge.        Left eye: No discharge.     Extraocular Movements: Extraocular movements intact.     Conjunctiva/sclera: Conjunctivae normal.     Pupils: Pupils are equal, round, and reactive to light.     Visual Fields: Right eye visual fields normal and left eye visual fields normal.  Neck:     Thyroid : No thyromegaly.     Vascular: No carotid bruit.     Trachea: Trachea normal.  Cardiovascular:     Rate and Rhythm: Normal rate and regular rhythm.     Heart sounds: Normal heart sounds. No murmur heard.    No gallop.  Pulmonary:      Effort: Pulmonary effort is normal. No accessory muscle usage or respiratory distress.     Breath sounds: Normal breath sounds.  Chest:  Breasts:    Right: Normal.     Left: Normal.  Abdominal:     General: Bowel sounds are normal.     Palpations: Abdomen is soft. There is no hepatomegaly or splenomegaly.     Tenderness: There is no abdominal tenderness.  Musculoskeletal:        General: Normal range of motion.     Cervical back: Normal range of motion and neck supple.     Right lower leg: No edema.     Left lower leg: No edema.  Lymphadenopathy:     Head:     Right side of head: No submental, submandibular, tonsillar, preauricular or posterior auricular adenopathy.     Left side of head: No submental, submandibular, tonsillar, preauricular or posterior auricular adenopathy.     Cervical: No cervical adenopathy.     Upper Body:     Right upper body: No supraclavicular, axillary or pectoral adenopathy.     Left upper body: No supraclavicular, axillary or pectoral adenopathy.  Skin:    General: Skin is warm and dry.     Capillary Refill:  Capillary refill takes less than 2 seconds.     Findings: No rash.  Neurological:     Mental Status: She is alert and oriented to person, place, and time.     Gait: Gait is intact.  Psychiatric:        Attention and Perception: Attention normal.        Mood and Affect: Mood normal.        Speech: Speech normal.        Behavior: Behavior normal. Behavior is cooperative.        Thought Content: Thought content normal.        Judgment: Judgment normal.     Results for orders placed or performed in visit on 11/12/22  Comp Met (CMET)   Collection Time: 11/12/22  1:54 PM  Result Value Ref Range   Glucose 100 (H) 70 - 99 mg/dL   BUN 5 (L) 6 - 20 mg/dL   Creatinine, Ser 9.24 0.57 - 1.00 mg/dL   eGFR 894 >40 fO/fpw/8.26   BUN/Creatinine Ratio 7 (L) 9 - 23   Sodium 142 134 - 144 mmol/L   Potassium 3.9 3.5 - 5.2 mmol/L   Chloride 103 96 - 106  mmol/L   CO2 23 20 - 29 mmol/L   Calcium  9.2 8.7 - 10.2 mg/dL   Total Protein 6.7 6.0 - 8.5 g/dL   Albumin 3.9 3.9 - 4.9 g/dL   Globulin, Total 2.8 1.5 - 4.5 g/dL   Bilirubin Total 0.3 0.0 - 1.2 mg/dL   Alkaline Phosphatase 59 44 - 121 IU/L   AST 18 0 - 40 IU/L   ALT 13 0 - 32 IU/L  B12   Collection Time: 11/12/22  1:54 PM  Result Value Ref Range   Vitamin B-12 314 232 - 1,245 pg/mL  Vitamin B1   Collection Time: 11/12/22  1:54 PM  Result Value Ref Range   Thiamine  88.1 66.5 - 200.0 nmol/L  CBC w/Diff   Collection Time: 11/12/22  1:54 PM  Result Value Ref Range   WBC 9.0 3.4 - 10.8 x10E3/uL   RBC 4.43 3.77 - 5.28 x10E6/uL   Hemoglobin 12.8 11.1 - 15.9 g/dL   Hematocrit 60.1 65.9 - 46.6 %   MCV 90 79 - 97 fL   MCH 28.9 26.6 - 33.0 pg   MCHC 32.2 31.5 - 35.7 g/dL   RDW 86.4 88.2 - 84.5 %   Platelets 241 150 - 450 x10E3/uL   Neutrophils 60 Not Estab. %   Lymphs 29 Not Estab. %   Monocytes 8 Not Estab. %   Eos 2 Not Estab. %   Basos 1 Not Estab. %   Neutrophils Absolute 5.4 1.4 - 7.0 x10E3/uL   Lymphocytes Absolute 2.6 0.7 - 3.1 x10E3/uL   Monocytes Absolute 0.7 0.1 - 0.9 x10E3/uL   EOS (ABSOLUTE) 0.2 0.0 - 0.4 x10E3/uL   Basophils Absolute 0.1 0.0 - 0.2 x10E3/uL   Immature Granulocytes 0 Not Estab. %   Immature Grans (Abs) 0.0 0.0 - 0.1 x10E3/uL  Vitamin D  (25 hydroxy)   Collection Time: 11/12/22  1:54 PM  Result Value Ref Range   Vit D, 25-Hydroxy 35.0 30.0 - 100.0 ng/mL  Lipid Profile   Collection Time: 11/12/22  1:54 PM  Result Value Ref Range   Cholesterol, Total 200 (H) 100 - 199 mg/dL   Triglycerides 899 0 - 149 mg/dL   HDL 62 >60 mg/dL   VLDL Cholesterol Cal 18 5 - 40 mg/dL   LDL  Chol Calc (NIH) 120 (H) 0 - 99 mg/dL   Chol/HDL Ratio 3.2 0.0 - 4.4 ratio      Assessment & Plan:   Problem List Items Addressed This Visit       Other   Alcohol abuse   Chronic. Controlled.  Drinking 1x per week and feels like she is managing it well.        Bipolar 1  disorder (HCC)   Chronic. Ongoing concern.  Not sure if she wants to start medication at this time.  She will follow up if she decides to start medication.  Otherwise, follow up in 6 months.      GAD (generalized anxiety disorder)   Chronic. Ongoing concern.  Not sure if she wants to start medication at this time.  She will follow up if she decides to start medication.  Otherwise, follow up in 6 months.      Hypercholesteremia   Labs ordered at visit today.  Will make recommendations based on lab results.        Relevant Orders   Lipid panel   Other Visit Diagnoses       Annual physical exam    -  Primary   Health maintenance reviewed during visit today.  Labs ordered.  Vaccines reviewed.  PAP up to date.   Relevant Orders   CBC with Differential/Platelet   Comprehensive metabolic panel with GFR   Lipid panel   TSH         Follow up plan: Return in about 6 months (around 02/14/2024) for Medication Management.   LABORATORY TESTING:  - Pap smear: up to date  IMMUNIZATIONS:   - Tdap: Tetanus vaccination status reviewed: last tetanus booster within 10 years. - Influenza: Up to date - Pneumovax: Not applicable - Prevnar: Not applicable - COVID: Not applicable - HPV: Not applicable - Shingrix vaccine: Not applicable  SCREENING: -Mammogram: Not applicable  - Colonoscopy: Not applicable  - Bone Density: Not applicable  -Hearing Test: Not applicable  -Spirometry: Not applicable   PATIENT COUNSELING:   Advised to take 1 mg of folate supplement per day if capable of pregnancy.   Sexuality: Discussed sexually transmitted diseases, partner selection, use of condoms, avoidance of unintended pregnancy  and contraceptive alternatives.   Advised to avoid cigarette smoking.  I discussed with the patient that most people either abstain from alcohol or drink within safe limits (<=14/week and <=4 drinks/occasion for males, <=7/weeks and <= 3 drinks/occasion for females) and that  the risk for alcohol disorders and other health effects rises proportionally with the number of drinks per week and how often a drinker exceeds daily limits.  Discussed cessation/primary prevention of drug use and availability of treatment for abuse.   Diet: Encouraged to adjust caloric intake to maintain  or achieve ideal body weight, to reduce intake of dietary saturated fat and total fat, to limit sodium intake by avoiding high sodium foods and not adding table salt, and to maintain adequate dietary potassium and calcium  preferably from fresh fruits, vegetables, and low-fat dairy products.    stressed the importance of regular exercise  Injury prevention: Discussed safety belts, safety helmets, smoke detector, smoking near bedding or upholstery.   Dental health: Discussed importance of regular tooth brushing, flossing, and dental visits.    NEXT PREVENTATIVE PHYSICAL DUE IN 1 YEAR. Return in about 6 months (around 02/14/2024) for Medication Management.

## 2023-08-14 NOTE — Assessment & Plan Note (Signed)
 Labs ordered at visit today.  Will make recommendations based on lab results.

## 2023-08-15 ENCOUNTER — Ambulatory Visit: Payer: Self-pay | Admitting: Nurse Practitioner

## 2023-08-15 LAB — LIPID PANEL
Chol/HDL Ratio: 2 ratio (ref 0.0–4.4)
Cholesterol, Total: 151 mg/dL (ref 100–199)
HDL: 76 mg/dL (ref >39)
LDL Chol Calc (NIH): 57 mg/dL (ref 0–99)
Triglycerides: 99 mg/dL (ref 0–149)
VLDL Cholesterol Cal: 18 mg/dL (ref 5–40)

## 2023-08-15 LAB — COMPREHENSIVE METABOLIC PANEL WITH GFR
ALT: 12 IU/L (ref 0–32)
AST: 18 IU/L (ref 0–40)
Albumin: 4.4 g/dL (ref 3.9–4.9)
Alkaline Phosphatase: 57 IU/L (ref 44–121)
BUN/Creatinine Ratio: 8 — ABNORMAL LOW (ref 9–23)
BUN: 8 mg/dL (ref 6–20)
Bilirubin Total: 0.4 mg/dL (ref 0.0–1.2)
CO2: 17 mmol/L — ABNORMAL LOW (ref 20–29)
Calcium: 9.2 mg/dL (ref 8.7–10.2)
Chloride: 103 mmol/L (ref 96–106)
Creatinine, Ser: 0.99 mg/dL (ref 0.57–1.00)
Globulin, Total: 2.6 g/dL (ref 1.5–4.5)
Glucose: 103 mg/dL — ABNORMAL HIGH (ref 70–99)
Potassium: 4 mmol/L (ref 3.5–5.2)
Sodium: 139 mmol/L (ref 134–144)
Total Protein: 7 g/dL (ref 6.0–8.5)
eGFR: 75 mL/min/1.73 (ref >59)

## 2023-08-15 LAB — CBC WITH DIFFERENTIAL/PLATELET
Basophils Absolute: 0.1 x10E3/uL (ref 0.0–0.2)
Basos: 1 %
EOS (ABSOLUTE): 0.4 x10E3/uL (ref 0.0–0.4)
Eos: 3 %
Hematocrit: 43 % (ref 34.0–46.6)
Hemoglobin: 14.1 g/dL (ref 11.1–15.9)
Immature Grans (Abs): 0 x10E3/uL (ref 0.0–0.1)
Immature Granulocytes: 0 %
Lymphocytes Absolute: 3 x10E3/uL (ref 0.7–3.1)
Lymphs: 28 %
MCH: 29.9 pg (ref 26.6–33.0)
MCHC: 32.8 g/dL (ref 31.5–35.7)
MCV: 91 fL (ref 79–97)
Monocytes Absolute: 0.8 x10E3/uL (ref 0.1–0.9)
Monocytes: 7 %
Neutrophils Absolute: 6.5 x10E3/uL (ref 1.4–7.0)
Neutrophils: 61 %
Platelets: 222 x10E3/uL (ref 150–450)
RBC: 4.71 x10E6/uL (ref 3.77–5.28)
RDW: 13.4 % (ref 11.7–15.4)
WBC: 10.8 x10E3/uL (ref 3.4–10.8)

## 2023-08-15 LAB — TSH: TSH: 2.9 u[IU]/mL (ref 0.450–4.500)

## 2024-02-20 ENCOUNTER — Ambulatory Visit: Admitting: Nurse Practitioner

## 2024-03-11 ENCOUNTER — Ambulatory Visit: Admitting: Nurse Practitioner
